# Patient Record
Sex: Female | Born: 1985
Health system: Southern US, Community
[De-identification: ages and names within clinical notes are randomized; demographics above are authoritative.]

## PROBLEM LIST (undated history)

## (undated) DIAGNOSIS — Z800 Family history of malignant neoplasm of digestive organs: Secondary | ICD-10-CM

## (undated) DIAGNOSIS — Z803 Family history of malignant neoplasm of breast: Secondary | ICD-10-CM

## (undated) DIAGNOSIS — Z8041 Family history of malignant neoplasm of ovary: Secondary | ICD-10-CM

## (undated) DIAGNOSIS — K219 Gastro-esophageal reflux disease without esophagitis: Secondary | ICD-10-CM

## (undated) DIAGNOSIS — J45909 Unspecified asthma, uncomplicated: Secondary | ICD-10-CM

## (undated) DIAGNOSIS — J309 Allergic rhinitis, unspecified: Secondary | ICD-10-CM

## (undated) DIAGNOSIS — F419 Anxiety disorder, unspecified: Secondary | ICD-10-CM

## (undated) DIAGNOSIS — F32A Depression, unspecified: Secondary | ICD-10-CM

## (undated) DIAGNOSIS — R109 Unspecified abdominal pain: Secondary | ICD-10-CM

## (undated) DIAGNOSIS — E739 Lactose intolerance, unspecified: Secondary | ICD-10-CM

## (undated) DIAGNOSIS — G43909 Migraine, unspecified, not intractable, without status migrainosus: Secondary | ICD-10-CM

## (undated) DIAGNOSIS — F329 Major depressive disorder, single episode, unspecified: Secondary | ICD-10-CM

## (undated) HISTORY — DX: Family history of malignant neoplasm of ovary: Z80.41

## (undated) HISTORY — DX: Family history of malignant neoplasm of digestive organs: Z80.0

## (undated) HISTORY — DX: Unspecified asthma, uncomplicated: J45.909

## (undated) HISTORY — DX: Lactose intolerance, unspecified: E73.9

## (undated) HISTORY — DX: Anxiety disorder, unspecified: F41.9

## (undated) HISTORY — DX: Gastro-esophageal reflux disease without esophagitis: K21.9

## (undated) HISTORY — DX: Major depressive disorder, single episode, unspecified: F32.9

## (undated) HISTORY — DX: Unspecified abdominal pain: R10.9

## (undated) HISTORY — DX: Depression, unspecified: F32.A

## (undated) HISTORY — DX: Allergic rhinitis, unspecified: J30.9

## (undated) HISTORY — DX: Family history of malignant neoplasm of breast: Z80.3

## (undated) HISTORY — DX: Migraine, unspecified, not intractable, without status migrainosus: G43.909

---
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
## 2011-06-09 HISTORY — PX: DILATION AND CURETTAGE OF UTERUS: SHX78

## 2014-08-03 ENCOUNTER — Other Ambulatory Visit: Payer: Self-pay | Admitting: Physician Assistant

## 2014-08-03 DIAGNOSIS — R110 Nausea: Secondary | ICD-10-CM

## 2014-08-03 DIAGNOSIS — R1013 Epigastric pain: Secondary | ICD-10-CM

## 2014-08-03 DIAGNOSIS — K5900 Constipation, unspecified: Secondary | ICD-10-CM

## 2014-08-03 DIAGNOSIS — R1011 Right upper quadrant pain: Secondary | ICD-10-CM

## 2014-08-06 ENCOUNTER — Ambulatory Visit
Admission: RE | Admit: 2014-08-06 | Discharge: 2014-08-06 | Disposition: A | Discharge status: Home / Self Care | Payer: BLUE CROSS/BLUE SHIELD | Source: Ambulatory Visit | Attending: Physician Assistant | Admitting: Physician Assistant

## 2014-08-06 ENCOUNTER — Other Ambulatory Visit: Payer: Self-pay

## 2014-08-06 DIAGNOSIS — R1013 Epigastric pain: Secondary | ICD-10-CM

## 2014-08-06 DIAGNOSIS — R1011 Right upper quadrant pain: Secondary | ICD-10-CM

## 2014-08-06 DIAGNOSIS — R110 Nausea: Secondary | ICD-10-CM

## 2014-08-06 DIAGNOSIS — K5900 Constipation, unspecified: Secondary | ICD-10-CM

## 2014-10-11 ENCOUNTER — Other Ambulatory Visit (HOSPITAL_COMMUNITY): Payer: Self-pay | Admitting: Obstetrics & Gynecology

## 2014-10-11 ENCOUNTER — Other Ambulatory Visit (HOSPITAL_COMMUNITY)
Admission: RE | Admit: 2014-10-11 | Discharge: 2014-10-11 | Disposition: A | Discharge status: Home / Self Care | Payer: BLUE CROSS/BLUE SHIELD | Source: Ambulatory Visit | Attending: Obstetrics and Gynecology | Admitting: Obstetrics and Gynecology

## 2014-10-11 DIAGNOSIS — Z01419 Encounter for gynecological examination (general) (routine) without abnormal findings: Secondary | ICD-10-CM | POA: Diagnosis not present

## 2014-10-12 LAB — CYTOLOGY - PAP

## 2016-05-07 ENCOUNTER — Other Ambulatory Visit: Payer: Self-pay | Admitting: Family Medicine

## 2016-05-07 DIAGNOSIS — N63 Unspecified lump in unspecified breast: Secondary | ICD-10-CM

## 2016-05-14 ENCOUNTER — Ambulatory Visit
Admission: RE | Admit: 2016-05-14 | Discharge: 2016-05-14 | Disposition: A | Discharge status: Home / Self Care | Payer: 59 | Source: Ambulatory Visit | Attending: Family Medicine | Admitting: Family Medicine

## 2016-05-14 DIAGNOSIS — N63 Unspecified lump in unspecified breast: Secondary | ICD-10-CM

## 2017-06-08 NOTE — L&D Delivery Note (Signed)
Delivery Note At 8:53 PM a viable female was delivered via Vaginal, Spontaneous (Presentation: OA with compound presentation).  APGAR: 8, 9; weight pending  .   Placenta status: to L&D.  Cord:  with the following complications: .  Cord pH: none  Anesthesia:  epidrual Episiotomy: Right Mediolateral with 3rd degree extension (3a) Lacerations: 3rd degree Suture Repair: Interrupted 3-0 suture was used to approximate the sphinctor.  2-0 vicryl was used in a running fashion, the final closure was done with 3-0 vicryl.   Rectal exam was completed- no defects were appreciated  Est. Blood Loss (mL): 101  Mom to postpartum.  Baby to Couplet care / Skin to Skin.  Kathryn Bradley 02/28/2018, 9:24 PM

## 2017-07-07 LAB — OB RESULTS CONSOLE HEPATITIS B SURFACE ANTIGEN: HEP B S AG: NEGATIVE

## 2017-07-07 LAB — OB RESULTS CONSOLE ANTIBODY SCREEN: Antibody Screen: NEGATIVE

## 2017-07-07 LAB — OB RESULTS CONSOLE ABO/RH: RH TYPE: POSITIVE

## 2017-07-07 LAB — OB RESULTS CONSOLE GC/CHLAMYDIA
Chlamydia: NEGATIVE
Gonorrhea: NEGATIVE

## 2017-07-07 LAB — OB RESULTS CONSOLE RUBELLA ANTIBODY, IGM: Rubella: NON-IMMUNE/NOT IMMUNE

## 2017-07-07 LAB — OB RESULTS CONSOLE RPR: RPR: NONREACTIVE

## 2017-07-07 LAB — OB RESULTS CONSOLE HIV ANTIBODY (ROUTINE TESTING): HIV: NONREACTIVE

## 2017-08-24 ENCOUNTER — Telehealth: Payer: Self-pay | Admitting: Neurology

## 2017-08-24 ENCOUNTER — Encounter: Payer: Self-pay | Admitting: Neurology

## 2017-08-24 ENCOUNTER — Encounter: Payer: Self-pay | Admitting: *Deleted

## 2017-08-24 ENCOUNTER — Ambulatory Visit: Payer: 59 | Admitting: Neurology

## 2017-08-24 VITALS — BP 107/66 | HR 95 | Ht 63.0 in | Wt 134.6 lb

## 2017-08-24 DIAGNOSIS — R29898 Other symptoms and signs involving the musculoskeletal system: Secondary | ICD-10-CM | POA: Diagnosis not present

## 2017-08-24 DIAGNOSIS — R29818 Other symptoms and signs involving the nervous system: Secondary | ICD-10-CM

## 2017-08-24 DIAGNOSIS — R200 Anesthesia of skin: Secondary | ICD-10-CM

## 2017-08-24 DIAGNOSIS — R202 Paresthesia of skin: Secondary | ICD-10-CM

## 2017-08-24 DIAGNOSIS — H547 Unspecified visual loss: Secondary | ICD-10-CM | POA: Diagnosis not present

## 2017-08-24 NOTE — Patient Instructions (Signed)
Labs today MRI brain and cervical spine EMG/NCS of right arm for Carpal Tunnel Syndrome

## 2017-08-24 NOTE — Telephone Encounter (Signed)
Patient returned my call and she is scheduled for tues. 08/31/17 on the gna mobile unit.

## 2017-08-24 NOTE — Progress Notes (Signed)
[facility]    Provider:  Dr Jaynee Eagles Referring Provider: Donald Prose, MD Primary Care Physician:  Donald Prose, MD  CC:  shakiness  HPI:  Kathryn Bradley is a lovely 32 y.o. female here as a referral from Dr. Nancy Fetter for shakiness.  Past medical history of anxiety, depression, abdominal pain, migraine headache. She is pregnant. In November Thanksgiving she started feeling something in her right arm, weakness like she felt she had low glucose but it didn't help. Kept happening episodically would last for hours, she would weird and not right but eating wouldn't help, her right hand she would feel weak, she would have to lay her arm down. No tingling or numbness or visible shaking but felt internally shaky. No neck pain or radicular symptoms. She has woken up with the hand numb and tingly and her right foot big toe as well numb and tingly. No hx of focal neurologic symptoms, vision loss, she had a migraine once. She once had vision loss and vision spots in her vision growing up without a headache. No FHx of seizures or MS. No symptoms in the face. She has had muscle twitching in her right leg and left arm. No other focal neurologic deficits, associated symptoms, inciting events or modifiable factors.  Reviewed notes, labs and imaging from outside physicians, which showed:   CBC unremarkable, CMP unremarkable with BUN 11 and creatinine 0.70.  Otherwise unremarkable, TSH was normal 2.48, hCG negative, LDL 71, CRP normal, RF negative, sed rate normal, RPR nonreactive, HIV nonreactive.  Patient feels in her arms and her heart is beating really hard.  Not sure if it just her nares because she is super aware of how she is feeling or if it something else.  They referred her over here for this.  She reported weakness and shakiness.  Feeling shaky shortly after Thanksgiving a couple weeks ago.  This happened in the past of her blood sugar was low but she tried to eat something and symptoms did not  improve.  She tried switching coffee, she was drinking some with higher caffeine intake but this did not provide relief.  Symptoms occur daily.  She can occupy herself and forget about symptoms and notices again.  She has difficulty moving her thumb across the screen due to symptoms though she is not visibly shaking.  She has difficulty sleeping this is baseline for her.  She woke up with tingling in the right hand.  She feels her heart racing but she does report anxiety associated with symptoms.  Exam was negative.  Review of Systems: Patient complains of symptoms per HPI as well as the following symptoms: feeling cold, sleepiness, allergies. Pertinent negatives and positives per HPI. All others negative.   Social History   Socioeconomic History  . Marital status: Married    Spouse name: Not on file  . Number of children: Not on file  . Years of education: Not on file  . Highest education level: Associate degree: academic program  Social Needs  . Financial resource strain: Not on file  . Food insecurity - worry: Not on file  . Food insecurity - inability: Not on file  . Transportation needs - medical: Not on file  . Transportation needs - non-medical: Not on file  Occupational History  . Not on file  Tobacco Use  . Smoking status: Never Smoker  . Smokeless tobacco: Never Used  Substance and Sexual Activity  . Alcohol use: No    Frequency: Never  .  Drug use: No  . Sexual activity: Not on file  Other Topics Concern  . Not on file  Social History Narrative   She is currently [redacted] weeks pregnant with her first child (08/24/17).    Lives at home with her husband   Right handed   No caffeine    Family History  Problem Relation Age of Onset  . Breast cancer Mother   . Heart attack Mother   . Heart Problems Father     Past Medical History:  Diagnosis Date  . Abdominal pain   . Allergic rhinitis   . Anxiety   . Asthma   . Depression   . Esophageal reflux   . Lactose  intolerance   . Migraine headache     Past Surgical History:  Procedure Laterality Date  . WISDOM TOOTH EXTRACTION  2005    Current Outpatient Medications  Medication Sig Dispense Refill  . loratadine (CLARITIN) 10 MG tablet Take 10 mg by mouth daily.    . Prenatal Multivit-Min-Fe-FA (PRENATAL VITAMINS PO) Take 1 tablet by mouth daily. Multivitamin/multimineral with folic acid     No current facility-administered medications for this visit.     Allergies as of 08/24/2017  . (Not on File)    Vitals: BP 107/66 (BP Location: Left Arm, Patient Position: Sitting)   Pulse 95   Ht 5\' 3"  (1.6 m)   Wt 134 lb 9.6 oz (61.1 kg)   BMI 23.84 kg/m  Last Weight:  Wt Readings from Last 1 Encounters:  08/24/17 134 lb 9.6 oz (61.1 kg)   Last Height:   Ht Readings from Last 1 Encounters:  08/24/17 5\' 3"  (1.6 m)   Physical exam: Exam: Gen: NAD, conversant, well nourised, well groomed                     CV: RRR, no MRG. No Carotid Bruits. No peripheral edema, warm, nontender Eyes: Conjunctivae clear without exudates or hemorrhage  Neuro: Detailed Neurologic Exam  Speech:    Speech is normal; fluent and spontaneous with normal comprehension.  Cognition:    The patient is oriented to person, place, and time;     recent and remote memory intact;     language fluent;     normal attention, concentration,     fund of knowledge Cranial Nerves:    The pupils are equal, round, and reactive to light. The fundi are normal and spontaneous venous pulsations are present. Visual fields are full to finger confrontation. Extraocular movements are intact. Trigeminal sensation is intact and the muscles of mastication are normal. The face is symmetric. The palate elevates in the midline. Hearing intact. Voice is normal. Shoulder shrug is normal. The tongue has normal motion without fasciculations.   Coordination:    Normal finger to nose and heel to shin. Normal rapid alternating movements.    Gait:    Heel-toe and tandem gait are normal.   Motor Observation:    No asymmetry, no atrophy, and no involuntary movements noted. Tone:    Normal muscle tone.    Posture:    Posture is normal. normal erect    Strength:    Strength is V/V in the upper and lower limbs.      Sensation: decreased sensation right hand     Reflex Exam:  DTR's:    Deep tendon reflexes in the upper and lower extremities are normal bilaterally.   Toes:    The toes are downgoing bilaterally.  Clonus:    Clonus is absent.  Negative phalen's and tinel's sign      Assessment/Plan:  Lovely 32 year old with right arm weakness, vision loss, numbness and tingling in the right arm and leg and she has notice symptoms growing up need MRI brain and cervical spine to evaluate for MS.   EMG/NCS of the right arm(also left arm if right arm is normal) for CTS MRI brain and cervical spine to evaluate for multiple sclerosis given focal neurologic symptoms Labs  Orders Placed This Encounter  Procedures  . MR BRAIN WO CONTRAST  . MR CERVICAL SPINE WO CONTRAST  . ANA, IFA (with reflex)  . B12 and Folate Panel  . Methylmalonic acid, serum  . NCV with EMG(electromyography)   Cc: Donald Prose, MD   Sarina Ill, MD  [facility] [address] [place], [place] [address]  Phone [phone] Fax [phone]

## 2017-08-24 NOTE — Telephone Encounter (Signed)
[place]: [phone] & [phone] (exp. 08/24/17 to 10/08/17) left voicemail for patient to call back about scheduling mri.

## 2017-08-26 ENCOUNTER — Telehealth: Payer: Self-pay | Admitting: *Deleted

## 2017-08-26 LAB — B12 AND FOLATE PANEL: Vitamin B-12: 483 pg/mL (ref 232–1245)

## 2017-08-26 LAB — METHYLMALONIC ACID, SERUM: Methylmalonic Acid: 80 nmol/L (ref 0–378)

## 2017-08-26 LAB — ANTINUCLEAR ANTIBODIES, IFA: ANA Titer 1: NEGATIVE

## 2017-08-26 NOTE — Telephone Encounter (Signed)
Called pt & LVM asking for call back. When she calls back, please let her know that her labs are normal.

## 2017-08-26 NOTE — Telephone Encounter (Signed)
-----   Message from Melvenia Beam, MD sent at 08/26/2017 12:16 PM EDT ----- Labs normal thanks

## 2017-08-27 NOTE — Telephone Encounter (Signed)
Pt returned RN's call. Message relayed labs were normals. Pt was appreciative

## 2017-08-31 ENCOUNTER — Ambulatory Visit: Payer: 59

## 2017-08-31 DIAGNOSIS — R202 Paresthesia of skin: Secondary | ICD-10-CM

## 2017-08-31 DIAGNOSIS — H547 Unspecified visual loss: Secondary | ICD-10-CM

## 2017-08-31 DIAGNOSIS — R29818 Other symptoms and signs involving the nervous system: Secondary | ICD-10-CM

## 2017-08-31 DIAGNOSIS — R200 Anesthesia of skin: Secondary | ICD-10-CM | POA: Diagnosis not present

## 2017-08-31 DIAGNOSIS — R29898 Other symptoms and signs involving the musculoskeletal system: Secondary | ICD-10-CM

## 2017-09-01 ENCOUNTER — Ambulatory Visit: Payer: 59 | Admitting: Neurology

## 2017-09-01 ENCOUNTER — Ambulatory Visit (INDEPENDENT_AMBULATORY_CARE_PROVIDER_SITE_OTHER): Payer: 59 | Admitting: Neurology

## 2017-09-01 DIAGNOSIS — H547 Unspecified visual loss: Secondary | ICD-10-CM

## 2017-09-01 DIAGNOSIS — R29818 Other symptoms and signs involving the nervous system: Secondary | ICD-10-CM | POA: Diagnosis not present

## 2017-09-01 DIAGNOSIS — Z0289 Encounter for other administrative examinations: Secondary | ICD-10-CM

## 2017-09-01 DIAGNOSIS — R29898 Other symptoms and signs involving the musculoskeletal system: Secondary | ICD-10-CM | POA: Diagnosis not present

## 2017-09-01 DIAGNOSIS — R200 Anesthesia of skin: Secondary | ICD-10-CM | POA: Diagnosis not present

## 2017-09-01 DIAGNOSIS — R202 Paresthesia of skin: Secondary | ICD-10-CM | POA: Diagnosis not present

## 2017-09-02 ENCOUNTER — Encounter: Payer: Self-pay | Admitting: *Deleted

## 2017-09-02 ENCOUNTER — Telehealth: Payer: Self-pay | Admitting: *Deleted

## 2017-09-02 NOTE — Telephone Encounter (Addendum)
Called pt & LVM (ok per DPR) informing pt that both her MRI brain & MRI cervical spine were normal. Return call not required but encouraged pt to call with any questions. Left office number in message.   ----- Message from Melvenia Beam, MD sent at 09/01/2017  4:44 PM EDT ----- normal  Notes recorded by Melvenia Beam, MD on 09/01/2017 at 4:44 PM EDT Normal

## 2017-09-02 NOTE — Telephone Encounter (Signed)
error 

## 2017-09-02 NOTE — Procedures (Signed)
Full Name: Kathryn Bradley Gender: Female MRN #: [id number] Date of Birth: 02-Jul-1985    Visit Date: 09/01/2017 09:10 Age: 32 Years 24 Months Old Examining Physician: Sarina Ill, MD  Referring Physician: Donald Prose, MD  History: Lovely 32 year old with right arm weakness, numbness and tingling in the right arm and leg.  Summary: All nerves and muscles (as detailed in the following tables) were within normal limits.     Conclusion: Normal study  Sarina Ill M.D.  [facility] [place], [place] [address] Tel: [phone] Fax: [phone]        [facility]    Nerve / Sites Muscle Latency Ref. Amplitude Ref. Rel Amp Segments Distance Velocity Ref. Area    ms ms mV mV %  cm m/s m/s mVms  R Median - APB     Wrist APB 3.0 ?4.4 12.8 ?4.0 100 Wrist - APB 7   45.8     Upper arm APB 6.5  12.7  99 Upper arm - Wrist 20 57 ?49 45.4  R Ulnar - ADM     Wrist ADM 2.6 ?3.3 11.0 ?6.0 100 Wrist - ADM 7   32.9     B.Elbow ADM 5.8  11.2  101 B.Elbow - Wrist 18 56 ?49 34.4     A.Elbow ADM 7.5  11.3  101 A.Elbow - B.Elbow 10 58 ?49 33.5         A.Elbow - Wrist      R Peroneal - EDB     Ankle EDB 4.7 ?6.5 10.9 ?2.0 100 Ankle - EDB 9   34.3     Fib head EDB 10.2  10.3  94.5 Fib head - Ankle 28 51 ?44 32.9     Pop fossa EDB 12.7  10.5  102 Pop fossa - Fib head 12 49 ?44 38.1         Pop fossa - Ankle      R Tibial - AH     Ankle AH 4.0 ?5.8 27.4 ?4.0 100 Ankle - AH 9   63.9     Pop fossa AH 12.2  22.3  81.3 Pop fossa - Ankle 35 43 ?41 61.7             SNC    Nerve / Sites Rec. Site Peak Lat Ref.  Amp Ref. Segments Distance Peak Diff Ref.    ms ms V V  cm ms ms  R Radial - Anatomical snuff box (Forearm)     Forearm Wrist 2.4 ?2.9 49 ?15 Forearm - Wrist 10    R Sural - Ankle (Calf)     Calf Ankle 4.0 ?4.4 17 ?6 Calf - Ankle 14    R Superficial peroneal - Ankle     Lat leg Ankle 3.8 ?4.4 9 ?6 Lat leg - Ankle 14    R Median, Ulnar - Transcarpal comparison    Median Palm Wrist 2.0 ?2.2 53 ?35 Median Palm - Wrist 8       Ulnar Palm Wrist 2.0 ?2.2 54 ?12 Ulnar Palm - Wrist 8          Median Palm - Ulnar Palm  0.0 ?0.4  R Median - Orthodromic (Dig II, Mid palm)     Dig II Wrist 3.0 ?3.4 17 ?10 Dig II - Wrist 13    R Ulnar - Orthodromic, (Dig V, Mid palm)     Dig V Wrist 2.7 ?3.1 16 ?5 Dig  V - Wrist 11                   F  Wave    Nerve F Lat Ref.   ms ms  R Tibial - AH 47.2 ?56.0  R Ulnar - ADM 25.4 ?32.0         EMG full       EMG Summary Table    Spontaneous MUAP Recruitment  Muscle IA Fib PSW Fasc Other Amp Dur. Poly Pattern  R. Deltoid Normal None None None _______ Normal Normal Normal Normal  R. Triceps brachii Normal None None None _______ Normal Normal Normal Normal  R. Pronator teres Normal None None None _______ Normal Normal Normal Normal  R. First dorsal interosseous Normal None None None _______ Normal Normal Normal Normal  R. Opponens pollicis Normal None None None _______ Normal Normal Normal Normal  R. Iliopsoas Normal None None None _______ Normal Normal Normal Normal  R. Vastus medialis Normal None None None _______ Normal Normal Normal Normal  R. Tibialis anterior Normal None None None _______ Normal Normal Normal Normal  R. Gastrocnemius (Medial head) Normal None None None _______ Normal Normal Normal Normal  R. Extensor hallucis longus Normal None None None _______ Normal Normal Normal Normal

## 2017-09-02 NOTE — Progress Notes (Signed)
Full Name: Kathryn Bradley Gender: Female MRN #: [id number] Date of Birth: May 25, 1986    Visit Date: 09/01/2017 09:10 Age: 32 Years 35 Months Old Examining Physician: Sarina Ill, MD  Referring Physician: Donald Prose, MD  History: Lovely 32 year old with right arm weakness, numbness and tingling in the right arm and leg.  Summary: All nerves and muscles (as detailed in the following tables) were within normal limits.     Conclusion: Normal study  Sarina Ill M.D.  [facility] [place], [place] [address] Tel: [phone] Fax: [phone]        [facility]    Nerve / Sites Muscle Latency Ref. Amplitude Ref. Rel Amp Segments Distance Velocity Ref. Area    ms ms mV mV %  cm m/s m/s mVms  R Median - APB     Wrist APB 3.0 ?4.4 12.8 ?4.0 100 Wrist - APB 7   45.8     Upper arm APB 6.5  12.7  99 Upper arm - Wrist 20 57 ?49 45.4  R Ulnar - ADM     Wrist ADM 2.6 ?3.3 11.0 ?6.0 100 Wrist - ADM 7   32.9     B.Elbow ADM 5.8  11.2  101 B.Elbow - Wrist 18 56 ?49 34.4     A.Elbow ADM 7.5  11.3  101 A.Elbow - B.Elbow 10 58 ?49 33.5         A.Elbow - Wrist      R Peroneal - EDB     Ankle EDB 4.7 ?6.5 10.9 ?2.0 100 Ankle - EDB 9   34.3     Fib head EDB 10.2  10.3  94.5 Fib head - Ankle 28 51 ?44 32.9     Pop fossa EDB 12.7  10.5  102 Pop fossa - Fib head 12 49 ?44 38.1         Pop fossa - Ankle      R Tibial - AH     Ankle AH 4.0 ?5.8 27.4 ?4.0 100 Ankle - AH 9   63.9     Pop fossa AH 12.2  22.3  81.3 Pop fossa - Ankle 35 43 ?41 61.7             SNC    Nerve / Sites Rec. Site Peak Lat Ref.  Amp Ref. Segments Distance Peak Diff Ref.    ms ms V V  cm ms ms  R Radial - Anatomical snuff box (Forearm)     Forearm Wrist 2.4 ?2.9 49 ?15 Forearm - Wrist 10    R Sural - Ankle (Calf)     Calf Ankle 4.0 ?4.4 17 ?6 Calf - Ankle 14    R Superficial peroneal - Ankle     Lat leg Ankle 3.8 ?4.4 9 ?6 Lat leg - Ankle 14    R Median, Ulnar - Transcarpal comparison    Median Palm Wrist 2.0 ?2.2 53 ?35 Median Palm - Wrist 8       Ulnar Palm Wrist 2.0 ?2.2 54 ?12 Ulnar Palm - Wrist 8          Median Palm - Ulnar Palm  0.0 ?0.4  R Median - Orthodromic (Dig II, Mid palm)     Dig II Wrist 3.0 ?3.4 17 ?10 Dig II - Wrist 13    R Ulnar - Orthodromic, (Dig V, Mid palm)     Dig V Wrist 2.7 ?3.1 16 ?5 Dig  V - Wrist 11                   F  Wave    Nerve F Lat Ref.   ms ms  R Tibial - AH 47.2 ?56.0  R Ulnar - ADM 25.4 ?32.0         EMG full       EMG Summary Table    Spontaneous MUAP Recruitment  Muscle IA Fib PSW Fasc Other Amp Dur. Poly Pattern  R. Deltoid Normal None None None _______ Normal Normal Normal Normal  R. Triceps brachii Normal None None None _______ Normal Normal Normal Normal  R. Pronator teres Normal None None None _______ Normal Normal Normal Normal  R. First dorsal interosseous Normal None None None _______ Normal Normal Normal Normal  R. Opponens pollicis Normal None None None _______ Normal Normal Normal Normal  R. Iliopsoas Normal None None None _______ Normal Normal Normal Normal  R. Vastus medialis Normal None None None _______ Normal Normal Normal Normal  R. Tibialis anterior Normal None None None _______ Normal Normal Normal Normal  R. Gastrocnemius (Medial head) Normal None None None _______ Normal Normal Normal Normal  R. Extensor hallucis longus Normal None None None _______ Normal Normal Normal Normal

## 2017-09-02 NOTE — Progress Notes (Signed)
Discussed MRI images with patient, showed her the images and answered all questions. MRI of the brain and cervical spine normal. Reviewed labs with patient, B12, MMA, ANA all normal.  Follow up after the birth of her baby or sooner if symptoms worsen. No suggestion of MS or any neurologic/neurodegenerate disorder, neuropathy or myopathy. Also discussed EMG/NCS results which were normal. A total of 25 minutes was spent face-to-face with this patient. Over half this time was spent on counseling patient on the Paresthesias diagnosis and different diagnostic and therapeutic options, this time is in addition to time spent performing emg/ncs.

## 2017-09-02 NOTE — Progress Notes (Signed)
See procedure note.

## 2017-11-22 DIAGNOSIS — J069 Acute upper respiratory infection, unspecified: Secondary | ICD-10-CM | POA: Diagnosis not present

## 2017-11-30 DIAGNOSIS — Z3482 Encounter for supervision of other normal pregnancy, second trimester: Secondary | ICD-10-CM | POA: Diagnosis not present

## 2017-12-21 DIAGNOSIS — Z23 Encounter for immunization: Secondary | ICD-10-CM | POA: Diagnosis not present

## 2018-01-04 DIAGNOSIS — L918 Other hypertrophic disorders of the skin: Secondary | ICD-10-CM | POA: Diagnosis not present

## 2018-01-04 DIAGNOSIS — D229 Melanocytic nevi, unspecified: Secondary | ICD-10-CM | POA: Diagnosis not present

## 2018-01-31 DIAGNOSIS — Z3483 Encounter for supervision of other normal pregnancy, third trimester: Secondary | ICD-10-CM | POA: Diagnosis not present

## 2018-02-11 DIAGNOSIS — Z23 Encounter for immunization: Secondary | ICD-10-CM | POA: Diagnosis not present

## 2018-02-18 ENCOUNTER — Telehealth (HOSPITAL_COMMUNITY): Payer: Self-pay | Admitting: *Deleted

## 2018-02-18 ENCOUNTER — Encounter (HOSPITAL_COMMUNITY): Payer: Self-pay | Admitting: *Deleted

## 2018-02-18 LAB — OB RESULTS CONSOLE GBS: GBS: NEGATIVE

## 2018-02-18 NOTE — Telephone Encounter (Signed)
Preadmission screen  

## 2018-02-28 ENCOUNTER — Inpatient Hospital Stay (HOSPITAL_COMMUNITY): Payer: 59 | Admitting: Anesthesiology

## 2018-02-28 ENCOUNTER — Inpatient Hospital Stay (HOSPITAL_COMMUNITY)
Admission: AD | Admit: 2018-02-28 | Discharge: 2018-03-02 | DRG: 768 | Disposition: A | Discharge status: Home / Self Care | Payer: 59 | Attending: Obstetrics & Gynecology | Admitting: Obstetrics & Gynecology

## 2018-02-28 ENCOUNTER — Encounter (HOSPITAL_COMMUNITY): Payer: Self-pay | Admitting: *Deleted

## 2018-02-28 DIAGNOSIS — O9962 Diseases of the digestive system complicating childbirth: Secondary | ICD-10-CM | POA: Diagnosis present

## 2018-02-28 DIAGNOSIS — Z3A40 40 weeks gestation of pregnancy: Secondary | ICD-10-CM

## 2018-02-28 DIAGNOSIS — O326XX0 Maternal care for compound presentation, not applicable or unspecified: Principal | ICD-10-CM | POA: Diagnosis present

## 2018-02-28 DIAGNOSIS — K219 Gastro-esophageal reflux disease without esophagitis: Secondary | ICD-10-CM | POA: Diagnosis present

## 2018-02-28 DIAGNOSIS — Z3483 Encounter for supervision of other normal pregnancy, third trimester: Secondary | ICD-10-CM | POA: Diagnosis not present

## 2018-02-28 LAB — CBC
HEMATOCRIT: 37 % (ref 36.0–46.0)
Hemoglobin: 12.6 g/dL (ref 12.0–15.0)
MCH: 31 pg (ref 26.0–34.0)
MCHC: 34.1 g/dL (ref 30.0–36.0)
MCV: 91.1 fL (ref 78.0–100.0)
PLATELETS: 238 10*3/uL (ref 150–400)
RBC: 4.06 MIL/uL (ref 3.87–5.11)
RDW: 13.7 % (ref 11.5–15.5)
WBC: 11.5 10*3/uL — AB (ref 4.0–10.5)

## 2018-02-28 LAB — ABO/RH: ABO/RH(D): A POS

## 2018-02-28 LAB — TYPE AND SCREEN
ABO/RH(D): A POS
ANTIBODY SCREEN: NEGATIVE

## 2018-02-28 LAB — RPR: RPR: NONREACTIVE

## 2018-02-28 MED ORDER — OXYTOCIN 40 UNITS IN LACTATED RINGERS INFUSION - SIMPLE MED
2.5000 [IU]/h | INTRAVENOUS | Status: DC
  Administered 2018-02-28: 2.5 [IU]/h via INTRAVENOUS
  Filled 2018-02-28: qty 1000

## 2018-02-28 MED ORDER — IBUPROFEN 600 MG PO TABS
600.0000 mg | ORAL_TABLET | Freq: Four times a day (QID) | ORAL | Status: DC
  Administered 2018-02-28 – 2018-03-02 (×7): 600 mg via ORAL
  Filled 2018-02-28 (×7): qty 1

## 2018-02-28 MED ORDER — DIBUCAINE 1 % RE OINT: 1.0000 "application " | TOPICAL_OINTMENT | RECTAL | Status: DC | PRN

## 2018-02-28 MED ORDER — DIPHENHYDRAMINE HCL 25 MG PO CAPS: 25.0000 mg | ORAL_CAPSULE | Freq: Four times a day (QID) | ORAL | Status: DC | PRN

## 2018-02-28 MED ORDER — CEFAZOLIN SODIUM-DEXTROSE 2-4 GM/100ML-% IV SOLN
2.0000 g | Freq: Three times a day (TID) | INTRAVENOUS | Status: DC
  Administered 2018-02-28 – 2018-03-01 (×2): 2 g via INTRAVENOUS
  Filled 2018-02-28 (×2): qty 100

## 2018-02-28 MED ORDER — MEASLES, MUMPS & RUBELLA VAC ~~LOC~~ INJ
0.5000 mL | INJECTION | Freq: Once | SUBCUTANEOUS | Status: AC
  Administered 2018-03-01: 0.5 mL via SUBCUTANEOUS
  Filled 2018-02-28: qty 0.5

## 2018-02-28 MED ORDER — OXYCODONE-ACETAMINOPHEN 5-325 MG PO TABS: 1.0000 | ORAL_TABLET | ORAL | Status: DC | PRN

## 2018-02-28 MED ORDER — BUTORPHANOL TARTRATE 1 MG/ML IJ SOLN: 1.0000 mg | INTRAMUSCULAR | Status: DC | PRN

## 2018-02-28 MED ORDER — FENTANYL 2.5 MCG/ML BUPIVACAINE 1/10 % EPIDURAL INFUSION (WH - ANES)
14.0000 mL/h | INTRAMUSCULAR | Status: DC | PRN
  Administered 2018-02-28 (×2): 14 mL/h via EPIDURAL
  Filled 2018-02-28 (×2): qty 100

## 2018-02-28 MED ORDER — PHENYLEPHRINE 40 MCG/ML (10ML) SYRINGE FOR IV PUSH (FOR BLOOD PRESSURE SUPPORT)
80.0000 ug | PREFILLED_SYRINGE | INTRAVENOUS | Status: DC | PRN
  Filled 2018-02-28: qty 5
  Filled 2018-02-28: qty 10

## 2018-02-28 MED ORDER — LIDOCAINE HCL (PF) 1 % IJ SOLN
30.0000 mL | INTRAMUSCULAR | Status: DC | PRN
  Filled 2018-02-28: qty 30

## 2018-02-28 MED ORDER — ACETAMINOPHEN 325 MG PO TABS
650.0000 mg | ORAL_TABLET | ORAL | Status: DC | PRN
  Administered 2018-03-01 – 2018-03-02 (×5): 650 mg via ORAL
  Filled 2018-02-28 (×5): qty 2

## 2018-02-28 MED ORDER — ONDANSETRON HCL 4 MG PO TABS: 4.0000 mg | ORAL_TABLET | ORAL | Status: DC | PRN

## 2018-02-28 MED ORDER — LACTATED RINGERS IV SOLN: 500.0000 mL | INTRAVENOUS | Status: DC | PRN

## 2018-02-28 MED ORDER — PHENYLEPHRINE 40 MCG/ML (10ML) SYRINGE FOR IV PUSH (FOR BLOOD PRESSURE SUPPORT)
80.0000 ug | PREFILLED_SYRINGE | INTRAVENOUS | Status: DC | PRN
  Filled 2018-02-28: qty 5

## 2018-02-28 MED ORDER — OXYTOCIN BOLUS FROM INFUSION
500.0000 mL | Freq: Once | INTRAVENOUS | Status: AC
  Administered 2018-02-28: 500 mL via INTRAVENOUS

## 2018-02-28 MED ORDER — EPHEDRINE 5 MG/ML INJ
10.0000 mg | INTRAVENOUS | Status: DC | PRN
  Filled 2018-02-28: qty 2

## 2018-02-28 MED ORDER — ACETAMINOPHEN 325 MG PO TABS
650.0000 mg | ORAL_TABLET | ORAL | Status: DC | PRN
  Administered 2018-02-28: 650 mg via ORAL
  Filled 2018-02-28: qty 2

## 2018-02-28 MED ORDER — SIMETHICONE 80 MG PO CHEW: 80.0000 mg | CHEWABLE_TABLET | ORAL | Status: DC | PRN

## 2018-02-28 MED ORDER — ZOLPIDEM TARTRATE 5 MG PO TABS: 5.0000 mg | ORAL_TABLET | Freq: Every evening | ORAL | Status: DC | PRN

## 2018-02-28 MED ORDER — BENZOCAINE-MENTHOL 20-0.5 % EX AERO
1.0000 "application " | INHALATION_SPRAY | CUTANEOUS | Status: DC | PRN
  Filled 2018-02-28: qty 56

## 2018-02-28 MED ORDER — WITCH HAZEL-GLYCERIN EX PADS
1.0000 "application " | MEDICATED_PAD | CUTANEOUS | Status: DC | PRN
  Administered 2018-03-01: 1 via TOPICAL

## 2018-02-28 MED ORDER — ONDANSETRON HCL 4 MG/2ML IJ SOLN: 4.0000 mg | INTRAMUSCULAR | Status: DC | PRN

## 2018-02-28 MED ORDER — DIPHENHYDRAMINE HCL 50 MG/ML IJ SOLN: 12.5000 mg | INTRAMUSCULAR | Status: DC | PRN

## 2018-02-28 MED ORDER — ONDANSETRON HCL 4 MG/2ML IJ SOLN: 4.0000 mg | Freq: Four times a day (QID) | INTRAMUSCULAR | Status: DC | PRN

## 2018-02-28 MED ORDER — SENNOSIDES-DOCUSATE SODIUM 8.6-50 MG PO TABS
2.0000 | ORAL_TABLET | ORAL | Status: DC
  Administered 2018-02-28 – 2018-03-01 (×2): 2 via ORAL
  Filled 2018-02-28 (×2): qty 2

## 2018-02-28 MED ORDER — PRENATAL MULTIVITAMIN CH
1.0000 | ORAL_TABLET | Freq: Every day | ORAL | Status: DC
  Administered 2018-03-01 – 2018-03-02 (×2): 1 via ORAL
  Filled 2018-02-28 (×2): qty 1

## 2018-02-28 MED ORDER — SOD CITRATE-CITRIC ACID 500-334 MG/5ML PO SOLN: 30.0000 mL | ORAL | Status: DC | PRN

## 2018-02-28 MED ORDER — LACTATED RINGERS IV SOLN
500.0000 mL | Freq: Once | INTRAVENOUS | Status: AC
  Administered 2018-02-28: 1000 mL via INTRAVENOUS

## 2018-02-28 MED ORDER — LACTATED RINGERS IV SOLN
INTRAVENOUS | Status: DC
  Administered 2018-02-28 (×3): via INTRAVENOUS

## 2018-02-28 MED ORDER — OXYCODONE-ACETAMINOPHEN 5-325 MG PO TABS: 2.0000 | ORAL_TABLET | ORAL | Status: DC | PRN

## 2018-02-28 MED ORDER — LIDOCAINE-EPINEPHRINE (PF) 2 %-1:200000 IJ SOLN
INTRAMUSCULAR | Status: DC | PRN
  Administered 2018-02-28 (×2): 4 mL via EPIDURAL
  Administered 2018-02-28: 2 mL via EPIDURAL

## 2018-02-28 MED ORDER — COCONUT OIL OIL: 1.0000 "application " | TOPICAL_OIL | Status: DC | PRN

## 2018-02-28 NOTE — Anesthesia Preprocedure Evaluation (Signed)
Anesthesia Evaluation  Patient identified by MRN, date of birth, ID band Patient awake    Reviewed: Allergy & Precautions, NPO status , Patient's Chart, lab work & pertinent test results  Airway Mallampati: I  TM Distance: >3 FB Neck ROM: Full    Dental no notable dental hx. (+) Teeth Intact   Pulmonary asthma ,    Pulmonary exam normal breath sounds clear to auscultation       Cardiovascular negative cardio ROS Normal cardiovascular exam Rhythm:Regular Rate:Normal     Neuro/Psych  Headaches, Anxiety Depression    GI/Hepatic Neg liver ROS, GERD  ,  Endo/Other  negative endocrine ROS  Renal/GU negative Renal ROS  negative genitourinary   Musculoskeletal negative musculoskeletal ROS (+)   Abdominal   Peds  Hematology negative hematology ROS (+)   Anesthesia Other Findings   Reproductive/Obstetrics (+) Pregnancy                             Anesthesia Physical Anesthesia Plan  ASA: III  Anesthesia Plan: Epidural   Post-op Pain Management:    Induction:   PONV Risk Score and Plan: Treatment may vary due to age or medical condition  Airway Management Planned: Natural Airway  Additional Equipment:   Intra-op Plan:   Post-operative Plan:   Informed Consent: I have reviewed the patients History and Physical, chart, labs and discussed the procedure including the risks, benefits and alternatives for the proposed anesthesia with the patient or authorized representative who has indicated his/her understanding and acceptance.     Plan Discussed with: Anesthesiologist  Anesthesia Plan Comments: (Patient identified. Risks, benefits, options discussed with patient including but not limited to bleeding, infection, nerve damage, paralysis, failed block, incomplete pain control, headache, blood pressure changes, nausea, vomiting, reactions to medication, itching, and post partum back pain.  Confirmed with bedside nurse the patient's most recent platelet count. Confirmed with the patient that they are not taking any anticoagulation, have any bleeding history or any family history of bleeding disorders. Patient expressed understanding and wishes to proceed. All questions were answered. )        Anesthesia Quick Evaluation

## 2018-02-28 NOTE — MAU Note (Signed)
Pt C/O uc's since 0200, have become progressively more frequent & intense.  Has some bloody mucus, denies LOF.  Reports good fetal movement.

## 2018-02-28 NOTE — H&P (Signed)
HPI: 32 y/o G2P0010 @ [redacted]w[redacted]d estimated gestational age (as dated by LMP c/w 20 week ultrasound) presents complaining of painful contractions.   no Leaking of Fluid,   Slight spotting,   + Uterine Contractions,  + Fetal Movement.  Prenatal care has been provided by Dr. Ozan  ROS: no HA, no epigastric pain, no visual changes.    Pregnancy complicated by: 1) GERD- zantac prn 2) Rubella- NON immune- will need MMR postpartum   Prenatal Transfer Tool  Maternal Diabetes: No Genetic Screening: Normal Maternal Ultrasounds/Referrals: Normal Fetal Ultrasounds or other Referrals:  None Maternal Substance Abuse:  No Significant Maternal Medications:  Meds include: Zantac Significant Maternal Lab Results: Lab values include: Group B Strep negative   PNL:  GBS neg, Rub NON-Immune, Hep B neg, RPR NR, HIV neg, GC/C neg, glucola:126 Hgb: 11.5 Blood type: A positive, antibody neg  Immunizations: Tdap: 7/16 Flu: 9/6  OBHx: SAB x 1 PMHx:  Seasonal allergies, acid reflux Meds:  PNV, allegra, zantac prn Allergy:   Allergies  Allergen Reactions  . Tape    SurgHx: none SocHx:   no Tobacco, no  EtOH, no Illicit Drugs  O: BP 130/76 (BP Location: Right Arm)   Pulse (!) 102   Temp 98 F (36.7 C) (Oral)   Resp 20   Ht 5' 3" (1.6 m)   Wt 71.7 kg   LMP 05/15/2017   BMI 27.99 kg/m    Gen. AAOx3, NAD CV.  RRR   Resp. Normal respiratory rate and effort Abd. Gravid,  no tenderness,  no rigidity,  no guarding Extr.  no edema B/L , no calf tenderness, neg Homan's B/L  FHT: 150 baseline, moderate variability, + accels,  no decels Toco: q 1-4 min SVE: 7/90/-1, AROM clear  Labs: see orders  A/P:  32 y.o. G2P0010 @ [redacted]w[redacted]d EGA who presents for labor -FWB:  NICHD Cat I FHTs -Labor: expectant management -GBS: negative -Pain management: continue epidural  Jennifer Ozan, DO [phone] (cell) [phone] (office)     

## 2018-02-28 NOTE — Anesthesia Pain Management Evaluation Note (Signed)
  CRNA Pain Management Visit Note  Patient: Kathryn Bradley, 32 y.o., female  "Hello I am a member of the anesthesia team at [facility]. We have an anesthesia team available at all times to provide care throughout the hospital, including epidural management and anesthesia for C-section. I don't know your plan for the delivery whether it a natural birth, water birth, IV sedation, nitrous supplementation, doula or epidural, but we want to meet your pain goals."   1.Was your pain managed to your expectations on prior hospitalizations?   No prior hospitalizations  2.What is your expectation for pain management during this hospitalization?     Various modes of pain relief were discussed including a labor epidural and IV pain medication.  3.How can we help you reach that goal? The patient wishes to consider her options for the time being.  Record the patient's initial score and the patient's pain goal.   Pain: 8  Pain Goal: 10 [facility] wants you to be able to say your pain was always managed very well.  Jaid Quirion 02/28/2018

## 2018-02-28 NOTE — Anesthesia Procedure Notes (Signed)
Epidural Patient location during procedure: OB Start time: 02/28/2018 12:35 PM End time: 02/28/2018 12:50 PM  Staffing Anesthesiologist: Freddrick March, MD Performed: anesthesiologist   Preanesthetic Checklist Completed: patient identified, pre-op evaluation, timeout performed, IV checked, risks and benefits discussed and monitors and equipment checked  Epidural Patient position: sitting Prep: site prepped and draped and DuraPrep Patient monitoring: continuous pulse ox, blood pressure, heart rate and cardiac monitor Approach: midline Location: L3-L4 Injection technique: LOR air  Needle:  Needle type: Tuohy  Needle gauge: 17 G Needle length: 9 cm Needle insertion depth: 4.5 cm Catheter type: closed end flexible Catheter size: 19 Gauge Catheter at skin depth: 10 cm Test dose: negative  Assessment Sensory level: T8 Events: blood not aspirated, injection not painful, no injection resistance, negative IV test and no paresthesia  Additional Notes Patient identified. Risks/Benefits/Options discussed with patient including but not limited to bleeding, infection, nerve damage, paralysis, failed block, incomplete pain control, headache, blood pressure changes, nausea, vomiting, reactions to medication both or allergic, itching and postpartum back pain. Confirmed with bedside nurse the patient's most recent platelet count. Confirmed with patient that they are not currently taking any anticoagulation, have any bleeding history or any family history of bleeding disorders. Patient expressed understanding and wished to proceed. All questions were answered. Sterile technique was used throughout the entire procedure. Please see nursing notes for vital signs. Test dose was given through epidural catheter and negative prior to continuing to dose epidural or start infusion. Warning signs of high block given to the patient including shortness of breath, tingling/numbness in hands, complete motor  block, or any concerning symptoms with instructions to call for help. Patient was given instructions on fall risk and not to get out of bed. All questions and concerns addressed with instructions to call with any issues or inadequate analgesia.  Reason for block:procedure for pain

## 2018-02-28 NOTE — Progress Notes (Signed)
OB PN:  S: Pt resting comfortably, no acute complaints  O: BP (!) 103/54   Pulse 85   Temp 97.9 F (36.6 C) (Oral)   Resp 18   Ht 5\' 3"  (1.6 m)   Wt 71.7 kg   LMP 05/15/2017   SpO2 97%   BMI 27.99 kg/m   FHT: 140bpm, moderate variablity, + accels, no decels Toco: q1-42min SVE: deferred, per RN 9/C/+1  A/P: 32 y.o. G1P0 @ [redacted]w[redacted]d for labor 1. FWB: Cat. I 2. Labor: expectant managemetn Pain: continue epidural GBS: negative  Janyth Pupa, DO [phone] (cell) [phone] (office)

## 2018-03-01 LAB — CBC
HCT: 29.4 % — ABNORMAL LOW (ref 36.0–46.0)
Hemoglobin: 10 g/dL — ABNORMAL LOW (ref 12.0–15.0)
MCH: 31.1 pg (ref 26.0–34.0)
MCHC: 34 g/dL (ref 30.0–36.0)
MCV: 91.3 fL (ref 78.0–100.0)
PLATELETS: 174 10*3/uL (ref 150–400)
RBC: 3.22 MIL/uL — ABNORMAL LOW (ref 3.87–5.11)
RDW: 14.1 % (ref 11.5–15.5)
WBC: 13.3 10*3/uL — AB (ref 4.0–10.5)

## 2018-03-01 NOTE — Progress Notes (Signed)
MOB was referred for history of depression/anxiety. * Referral screened out by Clinical Social Worker because none of the following criteria appear to apply: ~ History of anxiety/depression during this pregnancy, or of post-partum depression following prior delivery. ~ Diagnosis of anxiety and/or depression within last 3 years OR * MOB's symptoms currently being treated with medication and/or therapy. Please contact the Clinical Social Worker if needs arise, by MOB request, or if MOB scores greater than 9/yes to question 10 on Edinburgh Postpartum Depression Screen.  Demitrus Francisco Boyd-Gilyard, MSW, LCSW Clinical Social Work [phone]  

## 2018-03-01 NOTE — Anesthesia Postprocedure Evaluation (Signed)
Anesthesia Post Note  Patient: Kathryn Bradley  Procedure(s) Performed: AN AD HOC LABOR EPIDURAL     Patient location during evaluation: Mother Baby Anesthesia Type: Epidural Level of consciousness: awake and alert Pain management: pain level controlled Vital Signs Assessment: post-procedure vital signs reviewed and stable Respiratory status: spontaneous breathing, nonlabored ventilation and respiratory function stable Cardiovascular status: stable Postop Assessment: no headache, no backache, epidural receding, patient able to bend at knees, no apparent nausea or vomiting, able to ambulate and adequate PO intake Anesthetic complications: no    Last Vitals:  Vitals:   02/28/18 2356 03/01/18 0607  BP: 104/60 (!) 102/54  Pulse: 72 66  Resp: 18 18  Temp: 36.6 C 36.9 C  SpO2:      Last Pain:  Vitals:   03/01/18 0607  TempSrc: Oral  PainSc: 0-No pain   Pain Goal: Patients Stated Pain Goal: 10 (02/28/18 0857)               Jabier Mutton

## 2018-03-01 NOTE — Progress Notes (Signed)
Postpartum Note Day # 1  S:  Patient resting comfortable in bed.  Pain controlled.  Tolerating general diet. + flatus, no BM.  Lochia moderate.  Ambulating without difficulty.  She denies n/v/f/c, SOB, or CP.  Pt plans on breastfeeding.  O: Temp:  [97.8 F (36.6 C)-98.7 F (37.1 C)] 98.4 F (36.9 C) (09/24 0607) Pulse Rate:  [66-126] 66 (09/24 0607) Resp:  [16-20] 18 (09/24 0607) BP: (92-130)/(39-90) 102/54 (09/24 0607) SpO2:  [97 %-99 %] 97 % (09/23 1421) Weight:  [71.7 kg] 71.7 kg (09/23 0708)   Gen: A&Ox3, NAD CV: RRR Resp: CTAB Abdomen: soft, NT, ND Uterus: firm, non-tender, below umbilicus Ext: No edema, no calf tenderness bilaterally  Labs:  Recent Labs    02/28/18 0800 03/01/18 0513  HGB 12.6 10.0*    A/P: Pt is a 32 y.o. G2P1011 s/p NSVD, PPD#1  - Pain well controlled -GU: voiding freely -GI: Tolerating general diet -Activity: encouraged sitting up to chair and ambulation as tolerated -Prophylaxis: early ambulation -Labs: stable as above Continue routine postpartum care  Janyth Pupa, DO [phone] (cell) [phone] (office)

## 2018-03-02 MED ORDER — BISACODYL 5 MG PO TBEC
5.0000 mg | DELAYED_RELEASE_TABLET | Freq: Every day | ORAL | Status: DC | PRN
  Administered 2018-03-02: 5 mg via ORAL
  Filled 2018-03-02 (×2): qty 1

## 2018-03-02 MED ORDER — IBUPROFEN 600 MG PO TABS: 600.0000 mg | ORAL_TABLET | Freq: Four times a day (QID) | ORAL | 0 refills | Status: DC

## 2018-03-02 MED ORDER — DOCUSATE SODIUM 100 MG PO CAPS: 100.0000 mg | ORAL_CAPSULE | Freq: Two times a day (BID) | ORAL | 0 refills | Status: DC

## 2018-03-02 NOTE — Discharge Summary (Signed)
OB Discharge Summary     Patient Name: Kathryn Bradley DOB: June 22, 1985 MRN: [id number]  Date of admission: 02/28/2018 Delivering MD: Janyth Pupa   Date of discharge: 03/02/2018  Admitting diagnosis: 40WKS,LABOR Intrauterine pregnancy: [redacted]w[redacted]d     Secondary diagnosis:  Active Problems:   Labor and delivery, indication for care  Additional problems: none     Discharge diagnosis: Term Pregnancy Delivered                                                                                                Post partum procedures:none  Augmentation: AROM  Complications: None  Hospital course:  Onset of Labor With Vaginal Delivery     32 y.o. yo G2P1011 at [redacted]w[redacted]d was admitted in Latent Labor on 02/28/2018. Patient had an uncomplicated labor course as follows:  Membrane Rupture Time/Date: 1:04 PM ,02/28/2018   Intrapartum Procedures: Episiotomy: Right Mediolateral [4]                                         Lacerations:  3rd degree [4]  Patient had a delivery of a Viable infant. 02/28/2018  Information for the patient's newborn:  Tiann, Saha Girl Danne [[id number]]  Delivery Method: Vag-Spont    Pateint had an uncomplicated postpartum course.  She is ambulating, tolerating a regular diet, passing flatus, and urinating well. Patient is discharged home in stable condition on 03/02/18.   Physical exam  Vitals:   03/01/18 1028 03/01/18 1436 03/01/18 2347 03/02/18 0632  BP: 102/61 (!) 103/58 113/68 114/65  Pulse: 85 74 82 62  Resp: 20 18  18   Temp: 98.5 F (36.9 C) 98 F (36.7 C)  97.9 F (36.6 C)  TempSrc: Oral Oral  Oral  SpO2: 99% 99%    Weight:      Height:       General: alert, cooperative and no distress  CV: RRR Lungs: CTAB Lochia: appropriate Uterine Fundus: firm Incision: N/A DVT Evaluation: No evidence of DVT seen on physical exam. Labs: Lab Results  Component Value Date   WBC 13.3 (H) 03/01/2018   HGB 10.0 (L) 03/01/2018   HCT 29.4 (L) 03/01/2018   MCV 91.3 03/01/2018   PLT  174 03/01/2018   No flowsheet data found.  Discharge instruction: per After Visit Summary and "Baby and Me Booklet".  After visit meds:  Allergies as of 03/02/2018      Reactions   Tape Rash      Medication List    TAKE these medications   ALLEGRA PO Take 1 tablet by mouth daily.   docusate sodium 100 MG capsule Commonly known as:  COLACE Take 1 capsule (100 mg total) by mouth 2 (two) times daily.   ibuprofen 600 MG tablet Commonly known as:  ADVIL,MOTRIN Take 1 tablet (600 mg total) by mouth every 6 (six) hours.   IRON PO Take 1 tablet by mouth daily.   PRENATAL VITAMINS PO Take 1 tablet by mouth daily. Multivitamin/multimineral with folic acid   ranitidine 150  MG tablet Commonly known as:  ZANTAC Take 150 mg by mouth daily.       Diet: routine diet  Activity: Advance as tolerated. Pelvic rest for 6 weeks.   Outpatient follow up:6 weeks Follow up Appt:No future appointments. Follow up Visit:No follow-ups on file.  Postpartum contraception: Condoms  Newborn Data: Live born female  Birth Weight: 8 lb 9 oz (3884 g) APGAR: 8, 9  Newborn Delivery   Birth date/time:  02/28/2018 20:53:00 Delivery type:  Vaginal, Spontaneous     Baby Feeding: Bottle and Breast Disposition:home with mother   03/02/2018 Annalee Genta, DO

## 2018-03-02 NOTE — Discharge Instructions (Signed)
Vaginal Delivery, Care After °Refer to this sheet in the next few weeks. These instructions provide you with information about caring for yourself after vaginal delivery. Your health care provider may also give you more specific instructions. Your treatment has been planned according to current medical practices, but problems sometimes occur. Call your health care provider if you have any problems or questions. °What can I expect after the procedure? °After vaginal delivery, it is common to have: °· Some bleeding from your vagina. °· Soreness in your abdomen, your vagina, and the area of skin between your vaginal opening and your anus (perineum). °· Pelvic cramps. °· Fatigue. ° °Follow these instructions at home: °Medicines °· Take over-the-counter and prescription medicines only as told by your health care provider. °· If you were prescribed an antibiotic medicine, take it as told by your health care provider. Do not stop taking the antibiotic until it is finished. °Driving ° °· Do not drive or operate heavy machinery while taking prescription pain medicine. °· Do not drive for 24 hours if you received a sedative. °Lifestyle °· Do not drink alcohol. This is especially important if you are breastfeeding or taking medicine to relieve pain. °· Do not use tobacco products, including cigarettes, chewing tobacco, or e-cigarettes. If you need help quitting, ask your health care provider. °Eating and drinking °· Drink at least 8 eight-ounce glasses of water every day unless you are told not to by your health care provider. If you choose to breastfeed your baby, you may need to drink more water than this. °· Eat high-fiber foods every day. These foods may help prevent or relieve constipation. High-fiber foods include: °? Whole grain cereals and breads. °? Brown rice. °? Beans. °? Fresh fruits and vegetables. °Activity °· Return to your normal activities as told by your health care provider. Ask your health care provider  what activities are safe for you. °· Rest as much as possible. Try to rest or take a nap when your baby is sleeping. °· Do not lift anything that is heavier than your baby or 10 lb (4.5 kg) until your health care provider says that it is safe. °· Talk with your health care provider about when you can engage in sexual activity. This may depend on your: °? Risk of infection. °? Rate of healing. °? Comfort and desire to engage in sexual activity. °Vaginal Care °· If you have an episiotomy or a vaginal tear, check the area every day for signs of infection. Check for: °? More redness, swelling, or pain. °? More fluid or blood. °? Warmth. °? Pus or a bad smell. °· Do not use tampons or douches until your health care provider says this is safe. °· Watch for any blood clots that may pass from your vagina. These may look like clumps of dark red, brown, or black discharge. °General instructions °· Keep your perineum clean and dry as told by your health care provider. °· Wear loose, comfortable clothing. °· Wipe from front to back when you use the toilet. °· Ask your health care provider if you can shower or take a bath. If you had an episiotomy or a perineal tear during labor and delivery, your health care provider may tell you not to take baths for a certain length of time. °· Wear a bra that supports your breasts and fits you well. °· If possible, have someone help you with household activities and help care for your baby for at least a few days after   you leave the hospital. °· Keep all follow-up visits for you and your baby as told by your health care provider. This is important. °Contact a health care provider if: °· You have: °? Vaginal discharge that has a bad smell. °? Difficulty urinating. °? Pain when urinating. °? A sudden increase or decrease in the frequency of your bowel movements. °? More redness, swelling, or pain around your episiotomy or vaginal tear. °? More fluid or blood coming from your episiotomy or  vaginal tear. °? Pus or a bad smell coming from your episiotomy or vaginal tear. °? A fever. °? A rash. °? Little or no interest in activities you used to enjoy. °? Questions about caring for yourself or your baby. °· Your episiotomy or vaginal tear feels warm to the touch. °· Your episiotomy or vaginal tear is separating or does not appear to be healing. °· Your breasts are painful, hard, or turn red. °· You feel unusually sad or worried. °· You feel nauseous or you vomit. °· You pass large blood clots from your vagina. If you pass a blood clot from your vagina, save it to show to your health care provider. Do not flush blood clots down the toilet without having your health care provider look at them. °· You urinate more than usual. °· You are dizzy or light-headed. °· You have not breastfed at all and you have not had a menstrual period for 12 weeks after delivery. °· You have stopped breastfeeding and you have not had a menstrual period for 12 weeks after you stopped breastfeeding. °Get help right away if: °· You have: °? Pain that does not go away or does not get better with medicine. °? Chest pain. °? Difficulty breathing. °? Blurred vision or spots in your vision. °? Thoughts about hurting yourself or your baby. °· You develop pain in your abdomen or in one of your legs. °· You develop a severe headache. °· You faint. °· You bleed from your vagina so much that you fill two sanitary pads in one hour. °This information is not intended to replace advice given to you by your health care provider. Make sure you discuss any questions you have with your health care provider. °Document Released: 05/22/2000 Document Revised: 11/06/2015 Document Reviewed: 06/09/2015 °[employer] Interactive Patient Education © 2018 [employer] ° °

## 2018-03-04 ENCOUNTER — Inpatient Hospital Stay (HOSPITAL_COMMUNITY): Admission: RE | Admit: 2018-03-04 | Payer: 59 | Source: Ambulatory Visit

## 2018-03-17 DIAGNOSIS — R300 Dysuria: Secondary | ICD-10-CM | POA: Diagnosis not present

## 2018-10-04 ENCOUNTER — Other Ambulatory Visit: Payer: Self-pay | Admitting: Student

## 2018-10-04 DIAGNOSIS — R1033 Periumbilical pain: Secondary | ICD-10-CM

## 2018-10-06 ENCOUNTER — Other Ambulatory Visit: Payer: Self-pay | Admitting: Student

## 2018-10-10 ENCOUNTER — Other Ambulatory Visit: Payer: Self-pay

## 2018-10-10 ENCOUNTER — Ambulatory Visit
Admission: RE | Admit: 2018-10-10 | Discharge: 2018-10-10 | Disposition: A | Discharge status: Home / Self Care | Payer: Commercial Managed Care - PPO | Source: Ambulatory Visit | Attending: Student | Admitting: Student

## 2018-10-10 DIAGNOSIS — R1033 Periumbilical pain: Secondary | ICD-10-CM

## 2018-10-10 MED ORDER — IOPAMIDOL (ISOVUE-300) INJECTION 61%
100.0000 mL | Freq: Once | INTRAVENOUS | Status: AC | PRN
  Administered 2018-10-10: 100 mL via INTRAVENOUS

## 2019-05-10 ENCOUNTER — Other Ambulatory Visit: Payer: Self-pay

## 2019-05-10 DIAGNOSIS — Z20822 Contact with and (suspected) exposure to covid-19: Secondary | ICD-10-CM

## 2019-05-13 LAB — NOVEL CORONAVIRUS, NAA: SARS-CoV-2, NAA: NOT DETECTED

## 2019-06-08 ENCOUNTER — Ambulatory Visit: Payer: Commercial Managed Care - PPO | Attending: Internal Medicine

## 2019-06-08 DIAGNOSIS — Z20822 Contact with and (suspected) exposure to covid-19: Secondary | ICD-10-CM

## 2019-06-10 LAB — NOVEL CORONAVIRUS, NAA: SARS-CoV-2, NAA: NOT DETECTED

## 2020-02-22 ENCOUNTER — Other Ambulatory Visit: Payer: Self-pay | Admitting: Physician Assistant

## 2020-02-22 DIAGNOSIS — R1031 Right lower quadrant pain: Secondary | ICD-10-CM

## 2020-02-26 ENCOUNTER — Other Ambulatory Visit: Payer: Self-pay | Admitting: Physician Assistant

## 2020-02-26 DIAGNOSIS — R2242 Localized swelling, mass and lump, left lower limb: Secondary | ICD-10-CM

## 2020-02-28 ENCOUNTER — Ambulatory Visit
Admission: RE | Admit: 2020-02-28 | Discharge: 2020-02-28 | Disposition: A | Discharge status: Home / Self Care | Payer: Commercial Managed Care - PPO | Source: Ambulatory Visit | Attending: Physician Assistant | Admitting: Physician Assistant

## 2020-02-28 ENCOUNTER — Other Ambulatory Visit: Payer: Commercial Managed Care - PPO

## 2020-02-28 DIAGNOSIS — R2242 Localized swelling, mass and lump, left lower limb: Secondary | ICD-10-CM

## 2020-03-01 ENCOUNTER — Other Ambulatory Visit: Payer: Commercial Managed Care - PPO

## 2020-03-06 ENCOUNTER — Other Ambulatory Visit: Payer: Self-pay | Admitting: Physician Assistant

## 2020-03-06 DIAGNOSIS — R2242 Localized swelling, mass and lump, left lower limb: Secondary | ICD-10-CM

## 2020-03-07 ENCOUNTER — Ambulatory Visit
Admission: RE | Admit: 2020-03-07 | Discharge: 2020-03-07 | Disposition: A | Discharge status: Home / Self Care | Payer: Commercial Managed Care - PPO | Source: Ambulatory Visit | Attending: Physician Assistant | Admitting: Physician Assistant

## 2020-03-07 DIAGNOSIS — R1031 Right lower quadrant pain: Secondary | ICD-10-CM

## 2020-03-09 ENCOUNTER — Other Ambulatory Visit: Payer: Self-pay

## 2020-03-09 ENCOUNTER — Ambulatory Visit
Admission: RE | Admit: 2020-03-09 | Discharge: 2020-03-09 | Disposition: A | Discharge status: Home / Self Care | Payer: Commercial Managed Care - PPO | Source: Ambulatory Visit | Attending: Physician Assistant | Admitting: Physician Assistant

## 2020-03-09 DIAGNOSIS — R2242 Localized swelling, mass and lump, left lower limb: Secondary | ICD-10-CM

## 2020-03-14 ENCOUNTER — Other Ambulatory Visit: Payer: Self-pay | Admitting: Physician Assistant

## 2020-03-14 DIAGNOSIS — R2242 Localized swelling, mass and lump, left lower limb: Secondary | ICD-10-CM

## 2020-03-15 ENCOUNTER — Other Ambulatory Visit: Payer: Self-pay

## 2020-03-15 ENCOUNTER — Ambulatory Visit
Admission: RE | Admit: 2020-03-15 | Discharge: 2020-03-15 | Disposition: A | Discharge status: Home / Self Care | Payer: Commercial Managed Care - PPO | Source: Ambulatory Visit | Attending: Physician Assistant | Admitting: Physician Assistant

## 2020-03-15 DIAGNOSIS — R2242 Localized swelling, mass and lump, left lower limb: Secondary | ICD-10-CM

## 2020-03-15 MED ORDER — GADOBENATE DIMEGLUMINE 529 MG/ML IV SOLN
13.0000 mL | Freq: Once | INTRAVENOUS | Status: AC | PRN
  Administered 2020-03-15: 13 mL via INTRAVENOUS

## 2020-04-18 ENCOUNTER — Telehealth: Payer: Self-pay | Admitting: Genetic Counselor

## 2020-04-18 NOTE — Telephone Encounter (Signed)
Received a genetic counseling referral from Dr. Delora Fuel for fhx of breast cancer. Kathryn Bradley returned my call and has been scheduled to see [name] on 12/1 at [place]. Pt aware to arrive 20 minutes early.

## 2020-05-07 ENCOUNTER — Encounter: Payer: Self-pay | Admitting: Genetic Counselor

## 2020-05-08 ENCOUNTER — Inpatient Hospital Stay: Payer: Commercial Managed Care - PPO

## 2020-05-08 ENCOUNTER — Inpatient Hospital Stay: Payer: Commercial Managed Care - PPO | Admitting: Genetic Counselor

## 2020-06-05 ENCOUNTER — Other Ambulatory Visit: Payer: Self-pay | Admitting: Genetic Counselor

## 2020-06-05 ENCOUNTER — Inpatient Hospital Stay: Payer: Commercial Managed Care - PPO

## 2020-06-05 ENCOUNTER — Other Ambulatory Visit: Payer: Self-pay

## 2020-06-05 ENCOUNTER — Encounter: Payer: Self-pay | Admitting: Genetic Counselor

## 2020-06-05 ENCOUNTER — Inpatient Hospital Stay: Payer: Commercial Managed Care - PPO | Attending: Genetic Counselor | Admitting: Genetic Counselor

## 2020-06-05 DIAGNOSIS — Z803 Family history of malignant neoplasm of breast: Secondary | ICD-10-CM | POA: Diagnosis not present

## 2020-06-05 DIAGNOSIS — Z8041 Family history of malignant neoplasm of ovary: Secondary | ICD-10-CM | POA: Diagnosis not present

## 2020-06-05 DIAGNOSIS — Z800 Family history of malignant neoplasm of digestive organs: Secondary | ICD-10-CM | POA: Insufficient documentation

## 2020-06-05 LAB — GENETIC SCREENING ORDER

## 2020-06-05 NOTE — Progress Notes (Signed)
REFERRING PROVIDER: Drema Dallas, DO [address] [place],  [place] [address]  PRIMARY PROVIDER:  Scifres, Dorothy, PA-C  PRIMARY REASON FOR VISIT:  1. Family history of breast cancer   2. Family history of ovarian cancer   3. Family history of colon cancer      HISTORY OF PRESENT ILLNESS:   Ms. Bence, a 34 y.o. female, was seen for a [place] cancer genetics consultation at the request of Dr. Delora Fuel due to a family history of breast, ovarian, kidney and colon cancer.  Ms. Glasco presents to clinic today to discuss the possibility of a hereditary predisposition to cancer, genetic testing, and to further clarify her future cancer risks, as well as potential cancer risks for family members.   Ms. Rosenwald is a 34 y.o. female with no personal history of cancer.  She is concerned about her risk for breast cancer and desires to learn about genetic testing.  CANCER HISTORY:  Oncology History   No history exists.     RISK FACTORS:  Menarche was at age 45.  First live birth at age 9.  OCP use for approximately <5 years.  Ovaries intact: yes.  Hysterectomy: no.  Menopausal status: premenopausal.  HRT use: 0 years. Colonoscopy: no; not examined. Mammogram within the last year: no. Number of breast biopsies: 0. Up to date with pelvic exams: yes. Any excessive radiation exposure in the past: no  Past Medical History:  Diagnosis Date  . Abdominal pain   . Allergic rhinitis   . Anxiety   . Asthma   . Depression   . Esophageal reflux   . Family history of breast cancer   . Family history of colon cancer   . Family history of ovarian cancer   . Lactose intolerance   . Migraine headache     Past Surgical History:  Procedure Laterality Date  . WISDOM TOOTH EXTRACTION  2005    Social History   Socioeconomic History  . Marital status: Married    Spouse name: Not on file  . Number of children: Not on file  . Years of education: Not on file  . Highest education  level: Associate degree: academic program  Occupational History  . Not on file  Tobacco Use  . Smoking status: Never Smoker  . Smokeless tobacco: Never Used  Vaping Use  . Vaping Use: Never used  Substance and Sexual Activity  . Alcohol use: No  . Drug use: No  . Sexual activity: Yes  Other Topics Concern  . Not on file  Social History Narrative   She is currently [redacted] weeks pregnant with her first child (08/24/17).    Lives at home with her husband   Right handed   No caffeine   Social Determinants of Radio broadcast assistant Strain: Not on file  Food Insecurity: Not on file  Transportation Needs: Not on file  Physical Activity: Not on file  Stress: Not on file  Social Connections: Not on file     FAMILY HISTORY:  We obtained a detailed, 4-generation family history.  Significant diagnoses are listed below: Family History  Problem Relation Age of Onset  . Breast cancer Mother 66       triple negative  . Heart attack Mother   . Heart disease Mother   . Diabetes Mother   . Heart Problems Father 75  . Heart disease Father   . Diabetes Maternal Grandmother   . Ovarian cancer Maternal Grandmother   .  Macular degeneration Maternal Grandmother   . Colon cancer Maternal Grandmother   . Kidney cancer Maternal Grandfather   . Bladder Cancer Maternal Grandfather   . Skin cancer Maternal Grandfather   . Diabetes Maternal Grandfather   . Breast cancer Cousin        dx late 70s, mat first cousin  . Macular degeneration Maternal Aunt     The patient has one daughter who is cancer free.  She has one full sister and one maternal half sister who are cancer free.  Both parents are deceased.  The patient's mother had triple negative breast cancer in her early to mid 39's. She has three brothers and a sister who are cancer free. The sister has a daughter who had breast cancer in her late 64's.  The maternal grandparents are deceased.  The grandmother had ovarian and colon cancer  and the grandfather had kidney/bladder cancer.  The patient's father died of a heart attack at 59.  He was adopted.  Ms. Kinkade is unaware of previous family history of genetic testing for hereditary cancer risks. Patient's maternal ancestors are of Polish/Eastern European descent, and paternal ancestors are of Caucasian descent. There is no reported Ashkenazi Jewish ancestry. There is no known consanguinity.  GENETIC COUNSELING ASSESSMENT: Ms. Willard is a 34 y.o. female with a family history of cancer which is somewhat suggestive of a hereditary cancer syndrome and predisposition to cancer given the multiple generations, combination of cancer and young ages of onset. We, therefore, discussed and recommended the following at today's visit.   DISCUSSION: We discussed that 5 - 10% of cancer is hereditary, with each type of cancer having its own risk for being hereditary.  About 5-10% of breast cancer is hereditary where up to 20% of ovarian cancer is hereditary, most cases associated with BRCA mutations for both of these cancers.  There are other genes that can be associated with hereditary breast cancer syndromes.  These include ATM, CHEK2 and PALB2.  We discussed that testing is beneficial for several reasons including knowing how to follow individuals and understand if other family members could be at risk for cancer and allow them to undergo genetic testing.   We reviewed the characteristics, features and inheritance patterns of hereditary cancer syndromes. We also discussed genetic testing, including the appropriate family members to test, the process of testing, insurance coverage and turn-around-time for results. We discussed the implications of a negative, positive, carrier and/or variant of uncertain significant result. We recommended Ms. Kirtley pursue genetic testing for the CancerNext-Expanded+RNAinsight gene panel. The CancerNext-Expanded gene panel offered by [facility] and includes sequencing and  rearrangement analysis for the following 77 genes: AIP, ALK, APC*, ATM*, AXIN2, BAP1, BARD1, BLM, BMPR1A, BRCA1*, BRCA2*, BRIP1*, CDC73, CDH1*, CDK4, CDKN1B, CDKN2A, CHEK2*, CTNNA1, DICER1, FANCC, FH, FLCN, GALNT12, KIF1B, LZTR1, MAX, MEN1, MET, MLH1*, MSH2*, MSH3, MSH6*, MUTYH*, NBN, NF1*, NF2, NTHL1, PALB2*, PHOX2B, PMS2*, POT1, PRKAR1A, PTCH1, PTEN*, RAD51C*, RAD51D*, RB1, RECQL, RET, SDHA, SDHAF2, SDHB, SDHC, SDHD, SMAD4, SMARCA4, SMARCB1, SMARCE1, STK11, SUFU, TMEM127, TP53*, TSC1, TSC2, VHL and XRCC2 (sequencing and deletion/duplication); EGFR, EGLN1, HOXB13, KIT, MITF, PDGFRA, POLD1, and POLE (sequencing only); EPCAM and GREM1 (deletion/duplication only). DNA and RNA analyses performed for * genes.   Based on Ms. Matto's family history of cancer, she meets medical criteria for genetic testing. Despite that she meets criteria, she may still have an out of pocket cost. We discussed that if her out of pocket cost for testing is over $100, the laboratory  will call and confirm whether she wants to proceed with testing.  If the out of pocket cost of testing is less than $100 she will be billed by the genetic testing laboratory.   Based on Ms. Berthold's family history of cancer, she meets medical criteria for genetic testing. Despite that she meets criteria, she may still have an out of pocket cost.   Based on the patient's family history, a statistical model (Tyrer Cusik) was used to estimate her risk of developing breast cancer. This estimates her lifetime risk of developing breast cancer to be approximately 25%. This estimation does not consider any genetic testing results.  The patient's lifetime breast cancer risk is a preliminary estimate based on available information using one of several models endorsed by the [employer] (ACS). The ACS recommends consideration of breast MRI screening as an adjunct to mammography for patients at high risk (defined as 20% or greater lifetime risk). Please note  that a woman's breast cancer risk changes over time. It may increase or decrease based on age and any changes to the personal and/or family medical history. The risks and recommendations listed above apply to this patient at this point in time. In the future, she may or may not be eligible for the same medical management strategies and, in some cases, other medical management strategies may become available to her. If she is interested in an updated breast cancer risk assessment at a later date, she can contact us.  Ms. Ey has been determined to be at high risk for breast cancer.  Therefore, we recommend that annual screening with mammography and breast MRI be performed.  We discussed that Ms. Morea should discuss her individual situation with her referring physician and determine a breast cancer screening plan with which they are both comfortable.  Ms. Kerwood agreed to a referral to the [facility] High Risk Breast clinic.  We will make this referral after her test results are back.    PLAN: After considering the risks, benefits, and limitations, Ms. Glanz provided informed consent to pursue genetic testing and the blood sample was sent to [facility] for analysis of the CancerNext-Expanded+RNAinsight. Results should be available within approximately 2-3 weeks' time, at which point they will be disclosed by telephone to Ms. All, as will any additional recommendations warranted by these results. Ms. Amrein will receive a summary of her genetic counseling visit and a copy of her results once available. This information will also be available in Epic.   Lastly, we encouraged Ms. Isadore to remain in contact with cancer genetics annually so that we can continuously update the family history and inform her of any changes in cancer genetics and testing that may be of benefit for this family.   Ms. Laughner questions were answered to her satisfaction today. Our contact information was provided should additional  questions or concerns arise. Thank you for the referral and allowing [place] to share in the care of your patient.   Marlyn Tondreau P. Florene Glen, [place], [facility] Licensed, Insurance risk surveyor [name] phone: [phone]  The patient was seen for a total of 35 minutes in face-to-face genetic counseling.  This patient was discussed with Drs. Magrinat, Lindi Adie and/or Burr Medico who agrees with the above.    _______________________________________________________________________ For Office Staff:  Number of people involved in session: 1 Was an Intern/ student involved with case: no

## 2020-06-20 ENCOUNTER — Telehealth: Payer: Self-pay | Admitting: Genetic Counselor

## 2020-06-20 ENCOUNTER — Ambulatory Visit: Payer: Self-pay | Admitting: Genetic Counselor

## 2020-06-20 ENCOUNTER — Encounter: Payer: Self-pay | Admitting: Genetic Counselor

## 2020-06-20 DIAGNOSIS — Z1379 Encounter for other screening for genetic and chromosomal anomalies: Secondary | ICD-10-CM | POA: Insufficient documentation

## 2020-06-20 NOTE — Telephone Encounter (Signed)
I cld and lft the pt a vm to schedule an appt for the high risk clinic.

## 2020-06-20 NOTE — Progress Notes (Signed)
HPI:  Kathryn Bradley was previously seen in the [place] clinic due to a family history of breast and other cancers and concerns regarding a hereditary predisposition to cancer. Please refer to our prior cancer genetics clinic note for more information regarding our discussion, assessment and recommendations, at the time. Kathryn Bradley recent genetic test results were disclosed to her, as were recommendations warranted by these results. These results and recommendations are discussed in more detail below.  CANCER HISTORY:  Oncology History   No history exists.    FAMILY HISTORY:  We obtained a detailed, 4-generation family history.  Significant diagnoses are listed below: Family History  Problem Relation Age of Onset  . Breast cancer Mother 57       triple negative  . Heart attack Mother   . Heart disease Mother   . Diabetes Mother   . Heart Problems Father 71  . Heart disease Father   . Diabetes Maternal Grandmother   . Ovarian cancer Maternal Grandmother   . Macular degeneration Maternal Grandmother   . Colon cancer Maternal Grandmother   . Kidney cancer Maternal Grandfather   . Bladder Cancer Maternal Grandfather   . Skin cancer Maternal Grandfather   . Diabetes Maternal Grandfather   . Breast cancer Cousin        dx late 5s, mat first cousin  . Macular degeneration Maternal Aunt     The patient has one daughter who is cancer free.  She has one full sister and one maternal half sister who are cancer free.  Both parents are deceased.  The patient's mother had triple negative breast cancer in her early to mid 80's. She has three brothers and a sister who are cancer free. The sister has a daughter who had breast cancer in her late 5's.  The maternal grandparents are deceased.  The grandmother had ovarian and colon cancer and the grandfather had kidney/bladder cancer.  The patient's father died of a heart attack at 69.  He was adopted.  Kathryn Bradley is unaware of  previous family history of genetic testing for hereditary cancer risks. Patient's maternal ancestors are of Polish/Eastern European descent, and paternal ancestors are of Caucasian descent. There is no reported Ashkenazi Jewish ancestry. There is no known consanguinity.  GENETIC TEST RESULTS: Genetic testing reported out on June 19, 2020 through the CancerNext-Expanded+RNAinsight cancer panel found no pathogenic mutations. The CancerNext-Expanded gene panel offered by [facility] and includes sequencing and rearrangement analysis for the following 77 genes: AIP, ALK, APC*, ATM*, AXIN2, BAP1, BARD1, BLM, BMPR1A, BRCA1*, BRCA2*, BRIP1*, CDC73, CDH1*, CDK4, CDKN1B, CDKN2A, CHEK2*, CTNNA1, DICER1, FANCC, FH, FLCN, GALNT12, KIF1B, LZTR1, MAX, MEN1, MET, MLH1*, MSH2*, MSH3, MSH6*, MUTYH*, NBN, NF1*, NF2, NTHL1, PALB2*, PHOX2B, PMS2*, POT1, PRKAR1A, PTCH1, PTEN*, RAD51C*, RAD51D*, RB1, RECQL, RET, SDHA, SDHAF2, SDHB, SDHC, SDHD, SMAD4, SMARCA4, SMARCB1, SMARCE1, STK11, SUFU, TMEM127, TP53*, TSC1, TSC2, VHL and XRCC2 (sequencing and deletion/duplication); EGFR, EGLN1, HOXB13, KIT, MITF, PDGFRA, POLD1, and POLE (sequencing only); EPCAM and GREM1 (deletion/duplication only). DNA and RNA analyses performed for * genes. The test report has been scanned into EPIC and is located under the Molecular Pathology section of the Results Review tab.  A portion of the result report is included below for reference.     We discussed with Kathryn Bradley that because current genetic testing is not perfect, it is possible there may be a gene mutation in one of these genes that current testing cannot detect, but that chance is small.  We also discussed, that there could be another gene that has not yet been discovered, or that we have not yet tested, that is responsible for the cancer diagnoses in the family. It is also possible there is a hereditary cause for the cancer in the family that Kathryn Bradley did not inherit and therefore was not  identified in her testing.  Therefore, it is important to remain in touch with cancer genetics in the future so that we can continue to offer Kathryn Bradley the most up to date genetic testing.   ADDITIONAL GENETIC TESTING: We discussed with Kathryn Bradley that her genetic testing was fairly extensive.  If there are genes identified to increase cancer risk that can be analyzed in the future, we would be happy to discuss and coordinate this testing at that time.    CANCER SCREENING RECOMMENDATIONS: Kathryn Bradley test result is considered negative (normal).  This means that we have not identified a hereditary cause for her family history of cancer at this time. Most cancers happen by chance and this negative test suggests that her cancer may fall into this category.    While reassuring, this does not definitively rule out a hereditary predisposition to cancer. It is still possible that there could be genetic mutations that are undetectable by current technology. There could be genetic mutations in genes that have not been tested or identified to increase cancer risk.  Therefore, it is recommended she continue to follow the cancer management and screening guidelines provided by her oncology and primary healthcare provider.   An individual's cancer risk and medical management are not determined by genetic test results alone. Overall cancer risk assessment incorporates additional factors, including personal medical history, family history, and any available genetic information that may result in a personalized plan for cancer prevention and surveillance  Based on Kathryn Bradley's family of cancer, as well as her genetic test results, statistical models (Tyrer Cusik)  and literature data were used to estimate her risk of developing breast cancer. These estimate her lifetime risk of developing breast cancer to be approximately 25%.  The patient's lifetime breast cancer risk is a preliminary estimate based on available information  using one of several models endorsed by the [employer] (ACS). The ACS recommends consideration of breast MRI screening as an adjunct to mammography for patients at high risk (defined as 20% or greater lifetime risk). A more detailed breast cancer risk assessment can be considered, if clinically indicated.   We, therefore, discussed that it is reasonable for Kathryn Bradley to be followed by a high-risk breast cancer clinic; in addition to a yearly mammogram and physical exam by a healthcare provider, she should discuss the usefulness of an annual breast MRI with the high-risk clinic providers.  We will refer her to the high risk breast clinic at [facility].  RECOMMENDATIONS FOR FAMILY MEMBERS:  Individuals in this family might be at some increased risk of developing cancer, over the general population risk, simply due to the family history of cancer.  We recommended women in this family have a yearly mammogram beginning at age 5, or 55 years younger than the earliest onset of cancer, an annual clinical breast exam, and perform monthly breast self-exams. Women in this family should also have a gynecological exam as recommended by their primary provider. All family members should be referred for colonoscopy starting at age 24.  It is also possible there is a hereditary cause for the cancer in Kathryn Bradley family that she  did not inherit and therefore was not identified in her.  Based on Kathryn Bradley's family history, we recommended her maternal cousin, who was diagnosed with beast cancer in her 47's, have genetic counseling and testing. Kathryn Bradley will let us know if we can be of any assistance in coordinating genetic counseling and/or testing for this family member.   FOLLOW-UP: Lastly, we discussed with Kathryn Bradley that cancer genetics is a rapidly advancing field and it is possible that new genetic tests will be appropriate for her and/or her family members in the future. We encouraged her to remain in contact with  cancer genetics on an annual basis so we can update her personal and family histories and let her know of advances in cancer genetics that may benefit this family.   Our contact number was provided. Kathryn Bradley questions were answered to her satisfaction, and she knows she is welcome to call us at anytime with additional questions or concerns.   Roma Kayser, [place], [facility] Licensed, Certified Genetic Counselor [name]

## 2020-06-20 NOTE — Telephone Encounter (Signed)
Revealed negative genetic testing.  Discussed that we do not know why there is cancer in the family. It could be due to a different gene that we are not testing, or maybe our current technology may not be able to pick something up.  It will be important for her to keep in contact with genetics to keep up with whether additional testing may be needed.  

## 2020-06-20 NOTE — Telephone Encounter (Signed)
LM on VM that results are back and to please call. 

## 2020-06-20 NOTE — Telephone Encounter (Signed)
-----   Message from Clarene Essex, Counselor sent at 06/20/2020 11:40 AM EST ----- Negative genetic testing.  Pt at high risk for breast cancer.  Referring to the high risk breast clinic.

## 2020-07-25 ENCOUNTER — Telehealth: Payer: Self-pay | Admitting: Hematology and Oncology

## 2020-07-25 NOTE — Telephone Encounter (Signed)
Kathryn Bradley has cld to schedule an appt to be seen in the high risk clinic. An appt has been scheduled to see [name] on 3/3 at 10am. Pt aware to arrive 20 minutes early.

## 2020-08-08 ENCOUNTER — Telehealth: Payer: Self-pay | Admitting: Hematology and Oncology

## 2020-08-08 ENCOUNTER — Inpatient Hospital Stay: Payer: Commercial Managed Care - PPO | Admitting: Hematology and Oncology

## 2020-08-08 NOTE — Telephone Encounter (Signed)
Pt has been rescheduled to see Dr. Chryl Heck to 3/31 at 10am.

## 2020-09-05 ENCOUNTER — Other Ambulatory Visit: Payer: Self-pay

## 2020-09-05 ENCOUNTER — Encounter: Payer: Self-pay | Admitting: Hematology and Oncology

## 2020-09-05 ENCOUNTER — Inpatient Hospital Stay: Payer: Commercial Managed Care - PPO | Attending: Hematology and Oncology | Admitting: Hematology and Oncology

## 2020-09-05 VITALS — BP 102/69 | HR 81 | Temp 97.9°F | Resp 18 | Ht 65.0 in | Wt 137.3 lb

## 2020-09-05 DIAGNOSIS — Z9189 Other specified personal risk factors, not elsewhere classified: Secondary | ICD-10-CM | POA: Insufficient documentation

## 2020-09-05 DIAGNOSIS — Z8041 Family history of malignant neoplasm of ovary: Secondary | ICD-10-CM | POA: Insufficient documentation

## 2020-09-05 DIAGNOSIS — Z803 Family history of malignant neoplasm of breast: Secondary | ICD-10-CM | POA: Diagnosis not present

## 2020-09-05 NOTE — Addendum Note (Signed)
Addended by: Adaline Sill on: 09/05/2020 11:02 AM   Modules accepted: Orders

## 2020-09-05 NOTE — Progress Notes (Signed)
[facility] CONSULT NOTE  Patient Care Team: Scifres, Durel Salts as PCP - General (Physician Assistant)  CHIEF COMPLAINTS/PURPOSE OF CONSULTATION:  High risk breast cancer clinic.  ASSESSMENT & PLAN:  No problem-specific Assessment & Plan notes found for this encounter.  No orders of the defined types were placed in this encounter.  1.  Family history of breast cancer in mother and maternal cousin She had genetic testing and has no identifiable pathogenic mutations.  Her lifetime risk of breast cancer per [employer] model is around 27%.  Hence she was referred to high-risk breast clinic for further follow-up. She is quite healthy at baseline, denies any health complaints except for allergies. Physical examination today including breast exam quite unremarkable. We have confirmed that her lifetime risk of breast cancer based on TC more recently over 20% and hence we have discussed about imaging with MRIs and mammograms but since she is only 89 we have discussed about considering her first mammogram at age 59 and may be we can start serial mammograms at the age of 39 alternating with an MRI. We have discussed that the long-term risks of gadolinium deposition from subsequent MRIs is not clear.  She understands that an MRI can be very sensitive and can sometimes lead to unnecessary biopsies and false positive results. She is agreeable for follow-up with high-risk breast cancer clinic.  She will return to clinic in about 6 months. We have also discussed about lifestyle modifications including maintaining a healthy diet, over body mass index, minimizing alcohol intake and regular exercise.  Thank you for consulting [place] in the care of this patient.  Please not hesitate to contact us with any additional questions or concerns.  HISTORY OF PRESENTING ILLNESS:  Kathryn Bradley 35 y.o. female is here because of Life time risk of BC per TC model to be about 25%  This is a very pleasant  35 year old female patient who is very healthy referred to our high-risk breast cancer clinic given her family history of breast cancer.  Kathryn Bradley is here for an additional visit by herself.  She has significant family history of breast cancer in her mom who had bilateral breast cancer, breast cancer at the age of 98 in the setting of breast cancer at the age of 81 which was triple negative and she died from breast cancer at the age of 57.  Kathryn Bradley also has a maternal cousin who had breast cancer in her mid 53s.  Maternal grandmother had ovarian cancer.  Ms./personally has never had any breast biopsies or any known breast abnormalities.  She is healthy at baseline, has never had a mammogram but does self breast exam and denies any breast findings.  She denies any birth control usage.  Rest of the pertinent 10 point ROS reviewed and negative  Age of menarche: 49 Age at first pregnancy: 63 No birth control  REVIEW OF SYSTEMS:   Constitutional: Denies fevers, chills or abnormal night sweats Eyes: Denies blurriness of vision, double vision or watery eyes Ears, nose, mouth, throat, and face: Denies mucositis or sore throat Respiratory: Denies cough, dyspnea or wheezes Cardiovascular: Denies palpitation, chest discomfort or lower extremity swelling Gastrointestinal:  Denies nausea, heartburn or change in bowel habits Skin: Denies abnormal skin rashes Lymphatics: Denies new lymphadenopathy or easy bruising Neurological:Denies numbness, tingling or new weaknesses Behavioral/Psych: Mood is stable, no new changes  All other systems were reviewed with the patient and are negative.  MEDICAL HISTORY:  Past Medical History:  Diagnosis Date  . Abdominal pain   . Allergic rhinitis   . Anxiety   . Asthma   . Depression   . Esophageal reflux   . Family history of breast cancer   . Family history of colon cancer   . Family history of ovarian cancer   . Lactose intolerance   . Migraine headache      SURGICAL HISTORY: Past Surgical History:  Procedure Laterality Date  . WISDOM TOOTH EXTRACTION  2005    SOCIAL HISTORY: Social History   Socioeconomic History  . Marital status: Married    Spouse name: Not on file  . Number of children: Not on file  . Years of education: Not on file  . Highest education level: Associate degree: academic program  Occupational History  . Not on file  Tobacco Use  . Smoking status: Never Smoker  . Smokeless tobacco: Never Used  Vaping Use  . Vaping Use: Never used  Substance and Sexual Activity  . Alcohol use: No  . Drug use: No  . Sexual activity: Yes  Other Topics Concern  . Not on file  Social History Narrative      Lives at home with her husband   Right handed   No caffeine   Social Determinants of Radio broadcast assistant Strain: Not on file  Food Insecurity: Not on file  Transportation Needs: Not on file  Physical Activity: Not on file  Stress: Not on file  Social Connections: Not on file  Intimate Partner Violence: Not on file    FAMILY HISTORY: Family History  Problem Relation Age of Onset  . Breast cancer Mother 9       triple negative  . Heart attack Mother   . Heart disease Mother   . Diabetes Mother   . Heart Problems Father 61  . Heart disease Father   . Diabetes Maternal Grandmother   . Ovarian cancer Maternal Grandmother   . Macular degeneration Maternal Grandmother   . Colon cancer Maternal Grandmother   . Kidney cancer Maternal Grandfather   . Bladder Cancer Maternal Grandfather   . Skin cancer Maternal Grandfather   . Diabetes Maternal Grandfather   . Breast cancer Cousin        dx late 73s, mat first cousin  . Macular degeneration Maternal Aunt     ALLERGIES:  has No Known Allergies.  MEDICATIONS:  Current Outpatient Medications  Medication Sig Dispense Refill  . Fexofenadine HCl (ALLEGRA PO) Take 1 tablet by mouth daily.    Marland Kitchen docusate sodium (COLACE) 100 MG capsule Take 1 capsule  (100 mg total) by mouth 2 (two) times daily. 30 capsule 0  . ibuprofen (ADVIL,MOTRIN) 600 MG tablet Take 1 tablet (600 mg total) by mouth every 6 (six) hours. 30 tablet 0  . IRON PO Take 1 tablet by mouth daily.    . Prenatal Multivit-Min-Fe-FA (PRENATAL VITAMINS PO) Take 1 tablet by mouth daily. Multivitamin/multimineral with folic acid    . ranitidine (ZANTAC) 150 MG tablet Take 150 mg by mouth daily.     No current facility-administered medications for this visit.     PHYSICAL EXAMINATION:  ECOG PERFORMANCE STATUS: 0 - Asymptomatic  Vitals:   09/05/20 0945  BP: 102/69  Pulse: 81  Resp: 18  Temp: 97.9 F (36.6 C)  SpO2: 99%   Filed Weights   09/05/20 0945  Weight: 137 lb 4.8 oz (62.3 kg)    GENERAL:alert, no distress and comfortable SKIN: skin color,  texture, turgor are normal, no rashes or significant lesions EYES: normal, conjunctiva are pink and non-injected, sclera clear OROPHARYNX:no exudate, no erythema and lips, buccal mucosa, and tongue normal  NECK: supple, thyroid normal size, non-tender, without nodularity LYMPH:  no palpable lymphadenopathy in the cervical, axillary or inguinal LUNGS: clear to auscultation and percussion with normal breathing effort HEART: regular rate & rhythm and no murmurs and no lower extremity edema ABDOMEN:abdomen soft, non-tender and normal bowel sounds Musculoskeletal:no cyanosis of digits and no clubbing  PSYCH: alert & oriented x 3 with fluent speech NEURO: no focal motor/sensory deficits Breast exam: Bilateral breasts inspected and palpated.  No palpable masses or regional adenopathy.  LABORATORY DATA:  I have reviewed the data as listed Lab Results  Component Value Date   WBC 13.3 (H) 03/01/2018   HGB 10.0 (L) 03/01/2018   HCT 29.4 (L) 03/01/2018   MCV 91.3 03/01/2018   PLT 174 03/01/2018     Chemistry   No results found for: NA, K, CL, CO2, BUN, CREATININE, GLU No results found for: CALCIUM, ALKPHOS, AST, ALT, BILITOT     Lifetime risk of breast cancer per [employer] model based on personal history and family history is about 27%.   RADIOGRAPHIC STUDIES: I have personally reviewed the radiological images as listed and agreed with the findings in the report. No results found.  All questions were answered. The patient knows to call the clinic with any problems, questions or concerns. I spent 45 minutes in the care of this patient including H and P, review of records, counseling and coordination of care.     Benay Pike, MD 09/05/2020 10:14 AM

## 2020-09-23 ENCOUNTER — Encounter: Payer: Self-pay | Admitting: Allergy

## 2020-09-23 ENCOUNTER — Ambulatory Visit: Payer: Commercial Managed Care - PPO | Admitting: Allergy

## 2020-09-23 ENCOUNTER — Other Ambulatory Visit: Payer: Self-pay

## 2020-09-23 VITALS — BP 108/72 | HR 71 | Temp 98.2°F | Resp 18 | Ht 64.0 in | Wt 138.8 lb

## 2020-09-23 DIAGNOSIS — E739 Lactose intolerance, unspecified: Secondary | ICD-10-CM | POA: Diagnosis not present

## 2020-09-23 DIAGNOSIS — H1013 Acute atopic conjunctivitis, bilateral: Secondary | ICD-10-CM | POA: Diagnosis not present

## 2020-09-23 DIAGNOSIS — Z8709 Personal history of other diseases of the respiratory system: Secondary | ICD-10-CM | POA: Insufficient documentation

## 2020-09-23 DIAGNOSIS — J3089 Other allergic rhinitis: Secondary | ICD-10-CM | POA: Diagnosis not present

## 2020-09-23 MED ORDER — FLUTICASONE PROPIONATE 50 MCG/ACT NA SUSP: 1.0000 | Freq: Two times a day (BID) | NASAL | 5 refills | Status: DC | PRN

## 2020-09-23 NOTE — Patient Instructions (Addendum)
Today's skin testing showed: Positive to mold and cat.   Environmental allergies  Start environmental control measures as below.  May use over the counter antihistamines such as Allegra (fexofenadine) daily as needed.  May use Flonase (fluticasone) nasal spray 1 spray per nostril twice a day as needed for nasal congestion.   Nasal saline spray (i.e., Simply Saline) or nasal saline lavage (i.e., NeilMed) is recommended as needed and prior to medicated nasal sprays.  Read about allergy injections - handout given.   Breathing  Today's breathing test was normal.  Monitor symptoms.   Follow up in 2 months or sooner if needed.   Pet Allergen Avoidance: . Contrary to popular opinion, there are no "hypoallergenic" breeds of dogs or cats. That is because people are not allergic to an animal's hair, but to an allergen found in the animal's saliva, dander (dead skin flakes) or urine. Pet allergy symptoms typically occur within minutes. For some people, symptoms can build up and become most severe 8 to 12 hours after contact with the animal. People with severe allergies can experience reactions in public places if dander has been transported on the pet owners' clothing. Marland Kitchen Keeping an animal outdoors is only a partial solution, since homes with pets in the yard still have higher concentrations of animal allergens. . Before getting a pet, ask your allergist to determine if you are allergic to animals. If your pet is already considered part of your family, try to minimize contact and keep the pet out of the bedroom and other rooms where you spend a great deal of time. . As with dust mites, vacuum carpets often or replace carpet with a hardwood floor, tile or linoleum. . High-efficiency particulate air (HEPA) cleaners can reduce allergen levels over time. . While dander and saliva are the source of cat and dog allergens, urine is the source of allergens from rabbits, hamsters, mice and [place] pigs; so  ask a non-allergic family member to clean the animal's cage. . If you have a pet allergy, talk to your allergist about the potential for allergy immunotherapy (allergy shots). This strategy can often provide long-term relief.  Mold Control . Mold and fungi can grow on a variety of surfaces provided certain temperature and moisture conditions exist.  . Outdoor molds grow on plants, decaying vegetation and soil. The major outdoor mold, Alternaria and Cladosporium, are found in very high numbers during hot and dry conditions. Generally, a late summer - fall peak is seen for common outdoor fungal spores. Rain will temporarily lower outdoor mold spore count, but counts rise rapidly when the rainy period ends. . The most important indoor molds are Aspergillus and Penicillium. Dark, humid and poorly ventilated basements are ideal sites for mold growth. The next most common sites of mold growth are the bathroom and the kitchen. Outdoor (Seasonal) Mold Control . Use air conditioning and keep windows closed. . Avoid exposure to decaying vegetation. Marland Kitchen Avoid leaf raking. . Avoid grain handling. . Consider wearing a face mask if working in moldy areas.  Indoor (Perennial) Mold Control  . Maintain humidity below 50%. . Get rid of mold growth on hard surfaces with water, detergent and, if necessary, 5% bleach (do not mix with other cleaners). Then dry the area completely. If mold covers an area more than 10 square feet, consider hiring an indoor environmental professional. . For clothing, washing with soap and water is best. If moldy items cannot be cleaned and dried, throw them away. . Remove sources  e.g. contaminated carpets. . Repair and seal leaking roofs or pipes. Using dehumidifiers in damp basements may be helpful, but empty the water and clean units regularly to prevent mildew from forming. All rooms, especially basements, bathrooms and kitchens, require ventilation and cleaning to deter mold and mildew  growth. Avoid carpeting on concrete or damp floors, and storing items in damp areas.

## 2020-09-23 NOTE — Assessment & Plan Note (Signed)
Perennial rhinoconjunctivitis symptoms for 25 years.  Tried over-the-counter antihistamines with some benefit.  No prior allergy/ENT evaluation. 1 cat at home.   Today's skin testing showed: Positive to mold and cat.   Start environmental control measures as below.  May use over the counter antihistamines such as Allegra (fexofenadine) daily as needed.  May use Flonase (fluticasone) nasal spray 1 spray per nostril twice a day as needed for nasal congestion.   Nasal saline spray (i.e., Simply Saline) or nasal saline lavage (i.e., NeilMed) is recommended as needed and prior to medicated nasal sprays.  Read about allergy injections - handout given.

## 2020-09-23 NOTE — Progress Notes (Signed)
New Patient Note  RE: Kathryn Bradley MRN: [id number] DOB: 01-03-1986 Date of Office Visit: 09/23/2020  Consult requested by: Filiberto Pinks Primary care provider: Maude Leriche, PA-C  Chief Complaint: Allergy Testing  History of Present Illness: I had the pleasure of seeing Kathryn Bradley for initial evaluation at the [facility] on 09/23/2020. She is a 35 y.o. female, who is referred here by Scifres, Earlie Server, PA-C for the evaluation of allergic rhinitis.  She reports symptoms of nasal congestion, itchy ears/nose, throat clearing, rhinorrhea, itchy/watery eyes. Symptoms have been going on for 25 years. The symptoms are present all year around. Anosmia: no. Headache: yes. She has used Allegra, zyrtec, Claritin with some improvement in symptoms. Tried Flonase in the past with unknown benefit. Sinus infections: none. Previous work up includes: no. Previous ENT evaluation: no. Previous sinus imaging: no. History of nasal polyps: no. Last eye exam: within the past year. History of reflux: during pregnancy.  Assessment and Plan: Kathryn Bradley is a 35 y.o. female with: Other allergic rhinitis Perennial rhinoconjunctivitis symptoms for 25 years.  Tried over-the-counter antihistamines with some benefit.  No prior allergy/ENT evaluation. 1 cat at home.   Today's skin testing showed: Positive to mold and cat.   Start environmental control measures as below.  May use over the counter antihistamines such as Allegra (fexofenadine) daily as needed.  May use Flonase (fluticasone) nasal spray 1 spray per nostril twice a day as needed for nasal congestion.   Nasal saline spray (i.e., Simply Saline) or nasal saline lavage (i.e., NeilMed) is recommended as needed and prior to medicated nasal sprays.  Read about allergy injections - handout given.   Allergic conjunctivitis of both eyes  See assessment and plan as above for allergic rhinitis.  History of asthma History of asthma as  a child but never used her inhaler.  Sometimes has episodes of shortness of breath.  Today's spirometry was normal.  Declines albuterol Rx.  Monitor symptoms.   Return in about 2 months (around 11/23/2020).  Meds ordered this encounter  Medications  . fluticasone (FLONASE) 50 MCG/ACT nasal spray    Sig: Place 1 spray into both nostrils 2 (two) times daily as needed for allergies.    Dispense:  16 g    Refill:  5   Lab Orders  No laboratory test(s) ordered today    Other allergy screening: Asthma: yes  As a child but never used an inhaler.  Sometimes she does notice some shortness of breath.  No prior spirometry. Food allergy: no  Lactose intolerant.  Medication allergy: no Hymenoptera allergy: no Urticaria: no Eczema: yes as a child on the hands. History of recurrent infections suggestive of immunodeficency: no  Diagnostics: Spirometry:  Tracings reviewed. Her effort: Good reproducible efforts. FVC: 3.58L FEV1: 2.90L, 92% predicted FEV1/FVC ratio: 81% Interpretation: Spirometry consistent with normal pattern.  Please see scanned spirometry results for details.  Skin Testing: Environmental allergy panel. Positive test to: mold and cat. Results discussed with patient/family.  Airborne Adult Perc - 09/23/20 0859    Time Antigen Placed 0900    Allergen Manufacturer Lavella Hammock    Location Back    Number of Test 59    1. Control-Buffer 50% Glycerol 2+    2. Control-Histamine 1 mg/ml Negative    3. Albumin saline Negative    4. [place] Negative    5. [place] Negative    6. Johnson Negative    7. [place] Blue Negative    8. Victory Dakin  Fescue Negative    9. Perennial Rye Negative    10. Sweet Vernal Negative    11. Timothy Negative    12. Cocklebur Negative    13. Burweed Marshelder Negative    14. Ragweed, short Negative    15. Ragweed, Giant Negative    16. Plantain,  English Negative    17. Lamb's Quarters Negative    18. Sheep Sorrell Negative    19. Rough Pigweed  Negative    20. Marsh Elder, Rough Negative    21. Mugwort, Common Negative    22. Ash mix Negative    23. Birch mix Negative    24. Beech American Negative    25. Box, Elder Negative    26. Cedar, red Negative    27. Cottonwood, [place] Negative    28. Elm mix Negative    29. Hickory Negative    30. Maple mix Negative    31. Oak, [place] mix Negative    32. Pecan Pollen Negative    33. Pine mix Negative    34. Sycamore Eastern Negative    35. Beauregard, Black Pollen Negative    36. Alternaria alternata Negative    37. Cladosporium Herbarum Negative    38. Aspergillus mix Negative    39. Penicillium mix Negative    40. Bipolaris sorokiniana (Helminthosporium) Negative    41. Drechslera spicifera (Curvularia) Negative    42. Mucor plumbeus Negative    43. Fusarium moniliforme Negative    44. Aureobasidium pullulans (pullulara) Negative    45. Rhizopus oryzae Negative    46. Botrytis cinera Negative    47. Epicoccum nigrum Negative    48. Phoma betae Negative    49. Candida Albicans Negative    50. Trichophyton mentagrophytes Negative    51. Mite, D Farinae  5,000 AU/ml Negative    52. Mite, D Pteronyssinus  5,000 AU/ml Negative    53. Cat Hair 10,000 BAU/ml Negative    54.  Dog Epithelia Negative    55. Mixed Feathers Negative    56. Horse Epithelia Negative    57. Cockroach, German Negative    58. Mouse Negative    59. Tobacco Leaf Negative          Intradermal - 09/23/20 0937    Time Antigen Placed 1610    Location Arm    Number of Test 15    Control Negative    Gu[place] Negative    Johnson Negative    7 Grass Negative    Ragweed mix Negative    Weed mix Negative    Tree mix Negative    Mold 1 Negative    Mold 2 Negative    Mold 3 Negative    Mold 4 2+    Cat 2+    Dog Negative    Cockroach Negative    Mite mix Negative           Past Medical History: Patient Active Problem List   Diagnosis Date Noted  . Other allergic rhinitis 09/23/2020  .  History of asthma 09/23/2020  . Lactose intolerance 09/23/2020  . Allergic conjunctivitis of both eyes 09/23/2020  . Genetic testing 06/20/2020  . Family history of breast cancer   . Family history of ovarian cancer   . Family history of colon cancer   . Labor and delivery, indication for care 02/28/2018   Past Medical History:  Diagnosis Date  . Abdominal pain   . Allergic rhinitis   . Anxiety   .  Asthma   . Depression   . Esophageal reflux   . Family history of breast cancer   . Family history of colon cancer   . Family history of ovarian cancer   . Lactose intolerance   . Migraine headache    Past Surgical History: Past Surgical History:  Procedure Laterality Date  . WISDOM TOOTH EXTRACTION  2005   Medication List:  Current Outpatient Medications  Medication Sig Dispense Refill  . [employer] SUNSCREEN SPF 30 EX apply    . Fexofenadine HCl (ALLEGRA PO) Take 1 tablet by mouth daily.    . fluticasone (FLONASE) 50 MCG/ACT nasal spray Place 1 spray into both nostrils 2 (two) times daily as needed for allergies. 16 g 5   No current facility-administered medications for this visit.   Allergies: No Known Allergies Social History: Social History   Socioeconomic History  . Marital status: Married    Spouse name: Not on file  . Number of children: Not on file  . Years of education: Not on file  . Highest education level: Associate degree: academic program  Occupational History  . Not on file  Tobacco Use  . Smoking status: Never Smoker  . Smokeless tobacco: Never Used  Vaping Use  . Vaping Use: Never used  Substance and Sexual Activity  . Alcohol use: No  . Drug use: No  . Sexual activity: Yes  Other Topics Concern  . Not on file  Social History Narrative   She is currently [redacted] weeks pregnant with her first child (08/24/17).    Lives at home with her husband   Right handed   No caffeine   Social Determinants of Radio broadcast assistant Strain: Not on file  Food  Insecurity: Not on file  Transportation Needs: Not on file  Physical Activity: Not on file  Stress: Not on file  Social Connections: Not on file   Lives in a 34 year old house. Smoking: denies Occupation: Art gallery manager History: Water Damage/mildew in the house: no Carpet in the family room: yes Carpet in the bedroom: yes Heating: gas Cooling: central Pet: yes 1 cat x 14 yrs  Family History: Family History  Problem Relation Age of Onset  . Breast cancer Mother 69       triple negative  . Heart attack Mother   . Heart disease Mother   . Diabetes Mother   . Heart Problems Father 10  . Heart disease Father   . Diabetes Maternal Grandmother   . Ovarian cancer Maternal Grandmother   . Macular degeneration Maternal Grandmother   . Colon cancer Maternal Grandmother   . Kidney cancer Maternal Grandfather   . Bladder Cancer Maternal Grandfather   . Skin cancer Maternal Grandfather   . Diabetes Maternal Grandfather   . Breast cancer Cousin        dx late 28s, mat first cousin  . Macular degeneration Maternal Aunt    Problem                               Relation Asthma                                   Mother  Allergic rhino conjunctivitis     Mother, sisters  Review of Systems  Constitutional: Negative for appetite change, chills, fever and unexpected weight  change.  HENT: Positive for congestion, postnasal drip and rhinorrhea.   Eyes: Positive for itching.  Respiratory: Positive for cough. Negative for chest tightness, shortness of breath and wheezing.   Cardiovascular: Negative for chest pain.  Gastrointestinal: Negative for abdominal pain.  Genitourinary: Negative for difficulty urinating.  Skin: Negative for rash.  Allergic/Immunologic: Positive for environmental allergies.  Neurological: Positive for headaches.   Objective: BP 108/72 (BP Location: Left Arm, Patient Position: Sitting, Cuff Size: Normal)   Pulse 71   Temp 98.2 F (36.8 C)  (Temporal)   Resp 18   Ht 5\' 4"  (1.626 m)   Wt 138 lb 12.8 oz (63 kg)   SpO2 99%   BMI 23.82 kg/m  Body mass index is 23.82 kg/m. Physical Exam Vitals and nursing note reviewed.  Constitutional:      Appearance: Normal appearance. She is well-developed.  HENT:     Head: Normocephalic and atraumatic.     Right Ear: Tympanic membrane and external ear normal.     Left Ear: Tympanic membrane and external ear normal.     Nose: Nose normal.     Comments: Streaks of blood in left nares    Mouth/Throat:     Mouth: Mucous membranes are moist.     Pharynx: Oropharynx is clear.  Eyes:     Conjunctiva/sclera: Conjunctivae normal.  Cardiovascular:     Rate and Rhythm: Normal rate and regular rhythm.     Heart sounds: Normal heart sounds. No murmur heard. No friction rub. No gallop.   Pulmonary:     Effort: Pulmonary effort is normal.     Breath sounds: Normal breath sounds. No wheezing, rhonchi or rales.  Musculoskeletal:     Cervical back: Neck supple.  Skin:    General: Skin is warm.     Findings: No rash.  Neurological:     Mental Status: She is alert and oriented to person, place, and time.  Psychiatric:        Behavior: Behavior normal.    The plan was reviewed with the patient/family, and all questions/concerned were addressed.  It was my pleasure to see Kathryn Bradley today and participate in her care. Please feel free to contact me with any questions or concerns.  Sincerely,  Rexene Alberts, DO Allergy & Immunology  [facility] office: [place] office: [phone]

## 2020-09-23 NOTE — Assessment & Plan Note (Signed)
   See assessment and plan as above for allergic rhinitis.  

## 2020-09-23 NOTE — Assessment & Plan Note (Signed)
History of asthma as a child but never used her inhaler.  Sometimes has episodes of shortness of breath.  Today's spirometry was normal.  Declines albuterol Rx.  Monitor symptoms.

## 2020-11-27 ENCOUNTER — Ambulatory Visit: Payer: Commercial Managed Care - PPO | Admitting: Allergy

## 2021-01-23 ENCOUNTER — Ambulatory Visit: Payer: Commercial Managed Care - PPO | Admitting: Allergy & Immunology

## 2021-02-25 ENCOUNTER — Ambulatory Visit: Payer: Commercial Managed Care - PPO | Admitting: Allergy & Immunology

## 2021-02-25 ENCOUNTER — Encounter: Payer: Self-pay | Admitting: Allergy & Immunology

## 2021-02-25 ENCOUNTER — Other Ambulatory Visit: Payer: Self-pay

## 2021-02-25 VITALS — BP 110/76 | HR 80 | Temp 98.0°F | Resp 18 | Ht 63.0 in | Wt 142.6 lb

## 2021-02-25 DIAGNOSIS — J3089 Other allergic rhinitis: Secondary | ICD-10-CM

## 2021-02-25 MED ORDER — MONTELUKAST SODIUM 10 MG PO TABS: 10.0000 mg | ORAL_TABLET | Freq: Every day | ORAL | 5 refills | Status: DC

## 2021-02-25 MED ORDER — LEVOCETIRIZINE DIHYDROCHLORIDE 5 MG PO TABS: 5.0000 mg | ORAL_TABLET | Freq: Every evening | ORAL | 5 refills | Status: DC

## 2021-02-25 NOTE — Patient Instructions (Addendum)
Perennial allergic rhinitis (cat, mold) - Let's change to Xyzal instead of Allegra. - Add on Singulair (montelukast) 10mg .  - This can cause irritability and depression and weird dreams, so beware of that. - Consider nasal gel to help with moisturizing.  - Consider allergy shots.    2. Return in about 6 months (around 08/25/2021).    Please inform us of any Emergency Department visits, hospitalizations, or changes in symptoms. Call us before going to the ED for breathing or allergy symptoms since we might be able to fit you in for a sick visit. Feel free to contact us anytime with any questions, problems, or concerns.  It was a pleasure to meet you today!  Websites that have reliable patient information: 1. American Academy of Asthma, Allergy, and Immunology: www.aaaai.org 2. Food Allergy Research and Education (FARE): foodallergy.org 3. Mothers of Asthmatics: http://www.asthmacommunitynetwork.org 4. American College of Allergy, Asthma, and Immunology: www.acaai.org   COVID-19 Vaccine Information can be found at: ShippingScam.co.uk For questions related to vaccine distribution or appointments, please email [email or web address] or call [phone].   We realize that you might be concerned about having an allergic reaction to the COVID19 vaccines. To help with that concern, WE ARE OFFERING THE COVID19 VACCINES IN OUR OFFICE! Ask the front desk for dates!     "Like" [place] on Facebook and Instagram for our latest updates!      A healthy democracy works best when [employer] participate! Make sure you are registered to vote! If you have moved or changed any of your contact information, you will need to get this updated before voting!  In some cases, you MAY be able to register to vote online: [email or web address]

## 2021-02-25 NOTE — Progress Notes (Signed)
FOLLOW UP  Date of Service/Encounter:  02/25/21   Assessment:   Perennial allergic rhinitis (indoor molds, cat)  Plan/Recommendations:   Perennial allergic rhinitis (cat, mold) - Let's change to Xyzal instead of Allegra. - Add on Singulair (montelukast) 10mg .  - This can cause irritability and depression and weird dreams, so beware of that. - Consider nasal gel to help with moisturizing.  - Consider allergy shots.   2. Return in about 6 months (around 08/25/2021).      Subjective:   Kathryn Bradley is a 35 y.o. female presenting today for follow up of  Chief Complaint  Patient presents with   Allergic Rhinitis     No flares     Kathryn Bradley has a history of the following: Patient Active Problem List   Diagnosis Date Noted   Other allergic rhinitis 09/23/2020   History of asthma 09/23/2020   Lactose intolerance 09/23/2020   Allergic conjunctivitis of both eyes 09/23/2020   Genetic testing 06/20/2020   Family history of breast cancer    Family history of ovarian cancer    Family history of colon cancer    Labor and delivery, indication for care 02/28/2018    History obtained from: chart review and patient.  Kathryn Bradley is a 35 y.o. female presenting for a follow up visit. She was last seen in April 2022. At that time, she did skin testing with Dr. Maudie Mercury. She was positive only to indoor molds and cat. She was started on an OTC antihistamine as well as fluticasone. She was also started on nasal saline.   In the interim, she has mostly done well, although she does not feel much different than she did before she saw [place].  Allergic Rhinitis Symptom History: She is on Allegra and Flonase. This is working better than others, but she has been on allergy medication for years. She is allergic to cats that she has had for years. She does go into bedrooms. She has tried a slew of antihistamines. She has never been on montelukast. She has never had a prescription for allergy medications since  she was a kid until they were OTC.  She was positive to cat and mold.   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 She seems to like it.   Otherwise, there have been no changes to her past medical history, surgical history, family history, or social history.    Review of Systems  Constitutional: Negative.  Negative for chills, fever, malaise/fatigue and weight loss.  HENT:  Positive for congestion. Negative for ear discharge, ear pain and sinus pain.   Eyes:  Negative for pain, discharge and redness.  Respiratory:  Negative for cough, sputum production, shortness of breath and wheezing.   Cardiovascular: Negative.  Negative for chest pain and palpitations.  Gastrointestinal:  Negative for abdominal pain, constipation, diarrhea, heartburn, nausea and vomiting.  Skin: Negative.  Negative for itching and rash.  Neurological:  Negative for dizziness and headaches.  Endo/Heme/Allergies:  Positive for environmental allergies. Does not bruise/bleed easily.      Objective:   Blood pressure 110/76, pulse 80, temperature 98 F (36.7 C), resp. rate 18, height 5\' 3"  (1.6 m), weight 142 lb 9.6 oz (64.7 kg), SpO2 96 %, unknown if currently breastfeeding. Body mass index is 25.26 kg/m.   Physical Exam:  Physical Exam Vitals reviewed.  Constitutional:      Appearance: She is well-developed.  HENT:  Head: Normocephalic and atraumatic.     Right Ear: Tympanic membrane, ear canal and external ear normal.     Left Ear: Tympanic membrane, ear canal and external ear normal.     Nose: No nasal deformity, septal deviation, mucosal edema or rhinorrhea.     Right Turbinates: Enlarged, swollen and pale.     Left Turbinates: Enlarged, swollen and pale.     Right Sinus: No maxillary sinus tenderness or frontal sinus tenderness.     Left Sinus: No maxillary sinus tenderness or frontal sinus tenderness.     Mouth/Throat:     Mouth: Mucous membranes  are not pale and not dry.     Pharynx: Uvula midline.  Eyes:     General: Lids are normal. No allergic shiner.       Right eye: No discharge.        Left eye: No discharge.     Conjunctiva/sclera: Conjunctivae normal.     Right eye: Right conjunctiva is not injected. No chemosis.    Left eye: Left conjunctiva is not injected. No chemosis.    Pupils: Pupils are equal, round, and reactive to light.  Cardiovascular:     Rate and Rhythm: Normal rate and regular rhythm.     Heart sounds: Normal heart sounds.  Pulmonary:     Effort: Pulmonary effort is normal. No tachypnea, accessory muscle usage or respiratory distress.     Breath sounds: Normal breath sounds. No wheezing, rhonchi or rales.     Comments: Moving air well in all lung fields. No increased work of breathing noted.  Chest:     Chest wall: No tenderness.  Lymphadenopathy:     Cervical: No cervical adenopathy.  Skin:    Coloration: Skin is not pale.     Findings: No abrasion, erythema, petechiae or rash. Rash is not papular, urticarial or vesicular.  Neurological:     Mental Status: She is alert.  Psychiatric:        Behavior: Behavior is cooperative.     Diagnostic studies: none      Salvatore Marvel, MD  [facility]

## 2021-03-06 ENCOUNTER — Ambulatory Visit: Payer: Commercial Managed Care - PPO | Admitting: Hematology and Oncology

## 2021-03-06 ENCOUNTER — Telehealth: Payer: Self-pay | Admitting: Hematology and Oncology

## 2021-03-06 NOTE — Progress Notes (Deleted)
[facility] CONSULT NOTE  Patient Care Team: Scifres, Durel Salts as PCP - General (Physician Assistant)  CHIEF COMPLAINTS/PURPOSE OF CONSULTATION:  High risk breast cancer clinic.  ASSESSMENT & PLAN:  No problem-specific Assessment & Plan notes found for this encounter.  No orders of the defined types were placed in this encounter.  1.  Family history of breast cancer in mother and maternal cousin She had genetic testing and has no identifiable pathogenic mutations.  Her lifetime risk of breast cancer per [employer] model is around 27%.  Hence she was referred to high-risk breast clinic for further follow-up. She is quite healthy at baseline, denies any health complaints except for allergies. Physical examination today including breast exam quite unremarkable. We have confirmed that her lifetime risk of breast cancer based on TC more recently over 20% and hence we have discussed about imaging with MRIs and mammograms but since she is only 37 we have discussed about considering her first mammogram at age 38 and may be we can start serial mammograms at the age of 38 alternating with an MRI.  She is here for a follow up  Thank you for consulting [place] in the care of this patient.  Please not hesitate to contact us with any additional questions or concerns.  HISTORY OF PRESENTING ILLNESS:  Kathryn Bradley 35 y.o. female is here because of Life time risk of BC per TC model to be about 25%  This is a very pleasant 35 year old female patient who is very healthy referred to our high-risk breast cancer clinic given her family history of breast cancer.  Kathryn Bradley is here for an additional visit by herself.  She has significant family history of breast cancer in her mom who had bilateral breast cancer, breast cancer at the age of 30 in the setting of breast cancer at the age of 72 which was triple negative and she died from breast cancer at the age of 34.  Kathryn Bradley also has a maternal cousin  who had breast cancer in her mid 56s.  Maternal grandmother had ovarian cancer.  Kathryn Bradley has never had any breast biopsies or any known breast abnormalities.  She is healthy at baseline, has never had a mammogram but does self breast exam and denies any breast findings.  She denies any birth control usage.  Rest of the pertinent 10 point ROS reviewed and negative  Age of menarche: 62 Age at first pregnancy: 61 No birth control  REVIEW OF SYSTEMS:   Constitutional: Denies fevers, chills or abnormal night sweats Eyes: Denies blurriness of vision, double vision or watery eyes Ears, nose, mouth, throat, and face: Denies mucositis or sore throat Respiratory: Denies cough, dyspnea or wheezes Cardiovascular: Denies palpitation, chest discomfort or lower extremity swelling Gastrointestinal:  Denies nausea, heartburn or change in bowel habits Skin: Denies abnormal skin rashes Lymphatics: Denies new lymphadenopathy or easy bruising Neurological:Denies numbness, tingling or new weaknesses Behavioral/Psych: Mood is stable, no new changes  All other systems were reviewed with the patient and are negative.  MEDICAL HISTORY:  Past Medical History:  Diagnosis Date   Abdominal pain    Allergic rhinitis    Anxiety    Asthma    Depression    Esophageal reflux    Family history of breast cancer    Family history of colon cancer    Family history of ovarian cancer    Lactose intolerance    Migraine headache     SURGICAL HISTORY: Past Surgical  History:  Procedure Laterality Date   WISDOM TOOTH EXTRACTION  2005    SOCIAL HISTORY: Social History   Socioeconomic History   Marital status: Married    Spouse name: Not on file   Number of children: Not on file   Years of education: Not on file   Highest education level: Associate degree: academic program  Occupational History   Not on file  Tobacco Use   Smoking status: Never Smoker   Smokeless tobacco: Never Used  Vaping Use    Vaping Use: Never used  Substance and Sexual Activity   Alcohol use: No   Drug use: No   Sexual activity: Yes  Other Topics Concern   Not on file  Social History Narrative      Lives at home with her husband   Right handed   No caffeine   Social Determinants of Radio broadcast assistant Strain: Not on file  Food Insecurity: Not on file  Transportation Needs: Not on file  Physical Activity: Not on file  Stress: Not on file  Social Connections: Not on file  Intimate Partner Violence: Not on file    FAMILY HISTORY: Family History  Problem Relation Age of Onset   Breast cancer Mother 70       triple negative   Heart attack Mother    Heart disease Mother    Diabetes Mother    Heart Problems Father 66   Heart disease Father    Diabetes Maternal Grandmother    Ovarian cancer Maternal Grandmother    Macular degeneration Maternal Grandmother    Colon cancer Maternal Grandmother    Kidney cancer Maternal Grandfather    Bladder Cancer Maternal Grandfather    Skin cancer Maternal Grandfather    Diabetes Maternal Grandfather    Breast cancer Cousin        dx late 70s, mat first cousin   Macular degeneration Maternal Aunt     ALLERGIES:  has No Known Allergies.  MEDICATIONS:  Current Outpatient Medications  Medication Sig Dispense Refill   [employer] SUNSCREEN SPF 30 EX apply     Fexofenadine HCl (ALLEGRA PO) Take 1 tablet by mouth daily.     fluticasone (FLONASE) 50 MCG/ACT nasal spray Place 1 spray into both nostrils 2 (two) times daily as needed for allergies. 16 g 5   levocetirizine (XYZAL) 5 MG tablet Take 1 tablet (5 mg total) by mouth every evening. 30 tablet 5   montelukast (SINGULAIR) 10 MG tablet Take 1 tablet (10 mg total) by mouth at bedtime. 30 tablet 5   No current facility-administered medications for this visit.     PHYSICAL EXAMINATION:  ECOG PERFORMANCE STATUS: 0 - Asymptomatic  There were no vitals filed for this visit.  There were no vitals filed  for this visit.   GENERAL:alert, no distress and comfortable SKIN: skin color, texture, turgor are normal, no rashes or significant lesions EYES: normal, conjunctiva are pink and non-injected, sclera clear OROPHARYNX:no exudate, no erythema and lips, buccal mucosa, and tongue normal  NECK: supple, thyroid normal size, non-tender, without nodularity LYMPH:  no palpable lymphadenopathy in the cervical, axillary or inguinal LUNGS: clear to auscultation and percussion with normal breathing effort HEART: regular rate & rhythm and no murmurs and no lower extremity edema ABDOMEN:abdomen soft, non-tender and normal bowel sounds Musculoskeletal:no cyanosis of digits and no clubbing  PSYCH: alert & oriented x 3 with fluent speech NEURO: no focal motor/sensory deficits Breast exam: Bilateral breasts inspected and palpated.  No palpable masses or regional adenopathy.  LABORATORY DATA:  I have reviewed the data as listed Lab Results  Component Value Date   WBC 13.3 (H) 03/01/2018   HGB 10.0 (L) 03/01/2018   HCT 29.4 (L) 03/01/2018   MCV 91.3 03/01/2018   PLT 174 03/01/2018     Chemistry   No results found for: NA, K, CL, CO2, BUN, CREATININE, GLU No results found for: CALCIUM, ALKPHOS, AST, ALT, BILITOT    Lifetime risk of breast cancer per [employer] model based on personal history and family history is about 27%.   RADIOGRAPHIC STUDIES: I have personally reviewed the radiological images as listed and agreed with the findings in the report. No results found.  All questions were answered. The patient knows to call the clinic with any problems, questions or concerns. I spent 45 minutes in the care of this patient including H and P, review of records, counseling and coordination of care.     Benay Pike, MD 03/06/2021 9:11 AM

## 2021-03-06 NOTE — Telephone Encounter (Signed)
R/s appt per patient request in sch msg. Called and left msg.

## 2021-03-12 ENCOUNTER — Telehealth: Payer: Self-pay | Admitting: Hematology and Oncology

## 2021-03-12 ENCOUNTER — Inpatient Hospital Stay: Payer: Commercial Managed Care - PPO | Admitting: Hematology and Oncology

## 2021-03-12 NOTE — Telephone Encounter (Signed)
Rescheduled per 10/4 sch msg, pt has been called and confirmed appt

## 2021-03-12 NOTE — Progress Notes (Deleted)
[facility] CONSULT NOTE  Patient Care Team: Kathryn Bradley as PCP - General (Physician Assistant)  CHIEF COMPLAINTS/PURPOSE OF CONSULTATION:  High risk breast cancer clinic.  ASSESSMENT & PLAN:  No problem-specific Assessment & Plan notes found for this encounter.  No orders of the defined types were placed in this encounter.  1.  Family history of breast cancer in mother and maternal cousin She had genetic testing and has no identifiable pathogenic mutations.  Her lifetime risk of breast cancer per [employer] model is around 27%.  Hence she was referred to high-risk breast clinic for further follow-up. She is quite healthy at baseline, denies any health complaints except for allergies. Physical examination today including breast exam quite unremarkable. We have confirmed that her lifetime risk of breast cancer based on TC more recently over 20% and hence we have discussed about imaging with MRIs and mammograms but since she is only 24 we have discussed about considering her first mammogram at age 70 and may be we can start serial mammograms at the age of 65 alternating with an MRI. We have discussed that the long-term risks of gadolinium deposition from subsequent MRIs is not clear.  She understands that an MRI can be very sensitive and can sometimes lead to unnecessary biopsies and false positive results. She is agreeable for follow-up with high-risk breast cancer clinic.  She will return to clinic in about 6 months. We have also discussed about lifestyle modifications including maintaining a healthy diet, over body mass index, minimizing alcohol intake and regular exercise.  Thank you for consulting [place] in the care of this patient.  Please not hesitate to contact us with any additional questions or concerns.  HISTORY OF PRESENTING ILLNESS:  Kathryn Bradley 35 y.o. female is here because of Life time risk of BC per TC model to be about 25%  This is a very pleasant  34 year old female patient who is very healthy referred to our high-risk breast cancer clinic given her family history of breast cancer.  Kathryn Bradley is here for an additional visit by herself.  She has significant family history of breast cancer in her mom who had bilateral breast cancer, breast cancer at the age of 55 in the setting of breast cancer at the age of 67 which was triple negative and she died from breast cancer at the age of 34.  Kathryn Bradley also has a maternal cousin who had breast cancer in her mid 20s.  Maternal grandmother had ovarian cancer.  Ms./personally has never had any breast biopsies or any known breast abnormalities.  She is healthy at baseline, has never had a mammogram but does self breast exam and denies any breast findings.  She denies any birth control usage.  Rest of the pertinent 10 point ROS reviewed and negative  Age of menarche: 63 Age at first pregnancy: 75 No birth control  REVIEW OF SYSTEMS:   Constitutional: Denies fevers, chills or abnormal night sweats Eyes: Denies blurriness of vision, double vision or watery eyes Ears, nose, mouth, throat, and face: Denies mucositis or sore throat Respiratory: Denies cough, dyspnea or wheezes Cardiovascular: Denies palpitation, chest discomfort or lower extremity swelling Gastrointestinal:  Denies nausea, heartburn or change in bowel habits Skin: Denies abnormal skin rashes Lymphatics: Denies new lymphadenopathy or easy bruising Neurological:Denies numbness, tingling or new weaknesses Behavioral/Psych: Mood is stable, no new changes  All other systems were reviewed with the patient and are negative.  MEDICAL HISTORY:  Past Medical History:  Diagnosis Date   Abdominal pain    Allergic rhinitis    Anxiety    Asthma    Depression    Esophageal reflux    Family history of breast cancer    Family history of colon cancer    Family history of ovarian cancer    Lactose intolerance    Migraine headache     SURGICAL  HISTORY: Past Surgical History:  Procedure Laterality Date   WISDOM TOOTH EXTRACTION  2005    SOCIAL HISTORY: Social History   Socioeconomic History   Marital status: Married    Spouse name: Not on file   Number of children: Not on file   Years of education: Not on file   Highest education level: Associate degree: academic program  Occupational History   Not on file  Tobacco Use   Smoking status: Never Smoker   Smokeless tobacco: Never Used  Vaping Use   Vaping Use: Never used  Substance and Sexual Activity   Alcohol use: No   Drug use: No   Sexual activity: Yes  Other Topics Concern   Not on file  Social History Narrative      Lives at home with her husband   Right handed   No caffeine   Social Determinants of Radio broadcast assistant Strain: Not on file  Food Insecurity: Not on file  Transportation Needs: Not on file  Physical Activity: Not on file  Stress: Not on file  Social Connections: Not on file  Intimate Partner Violence: Not on file    FAMILY HISTORY: Family History  Problem Relation Age of Onset   Breast cancer Mother 49       triple negative   Heart attack Mother    Heart disease Mother    Diabetes Mother    Heart Problems Father 29   Heart disease Father    Diabetes Maternal Grandmother    Ovarian cancer Maternal Grandmother    Macular degeneration Maternal Grandmother    Colon cancer Maternal Grandmother    Kidney cancer Maternal Grandfather    Bladder Cancer Maternal Grandfather    Skin cancer Maternal Grandfather    Diabetes Maternal Grandfather    Breast cancer Cousin        dx late 4s, mat first cousin   Macular degeneration Maternal Aunt     ALLERGIES:  has No Known Allergies.  MEDICATIONS:  Current Outpatient Medications  Medication Sig Dispense Refill   [employer] SUNSCREEN SPF 30 EX apply     Fexofenadine HCl (ALLEGRA PO) Take 1 tablet by mouth daily.     fluticasone (FLONASE) 50 MCG/ACT nasal spray Place 1 spray into both  nostrils 2 (two) times daily as needed for allergies. 16 g 5   levocetirizine (XYZAL) 5 MG tablet Take 1 tablet (5 mg total) by mouth every evening. 30 tablet 5   montelukast (SINGULAIR) 10 MG tablet Take 1 tablet (10 mg total) by mouth at bedtime. 30 tablet 5   No current facility-administered medications for this visit.     PHYSICAL EXAMINATION:  ECOG PERFORMANCE STATUS: 0 - Asymptomatic  There were no vitals filed for this visit.  There were no vitals filed for this visit.   GENERAL:alert, no distress and comfortable SKIN: skin color, texture, turgor are normal, no rashes or significant lesions EYES: normal, conjunctiva are pink and non-injected, sclera clear OROPHARYNX:no exudate, no erythema and lips, buccal mucosa, and tongue normal  NECK: supple, thyroid normal size, non-tender, without nodularity LYMPH:  no palpable lymphadenopathy in the cervical, axillary or inguinal LUNGS: clear to auscultation and percussion with normal breathing effort HEART: regular rate & rhythm and no murmurs and no lower extremity edema ABDOMEN:abdomen soft, non-tender and normal bowel sounds Musculoskeletal:no cyanosis of digits and no clubbing  PSYCH: alert & oriented x 3 with fluent speech NEURO: no focal motor/sensory deficits Breast exam: Bilateral breasts inspected and palpated.  No palpable masses or regional adenopathy.  LABORATORY DATA:  I have reviewed the data as listed Lab Results  Component Value Date   WBC 13.3 (H) 03/01/2018   HGB 10.0 (L) 03/01/2018   HCT 29.4 (L) 03/01/2018   MCV 91.3 03/01/2018   PLT 174 03/01/2018     Chemistry   No results found for: NA, K, CL, CO2, BUN, CREATININE, GLU No results found for: CALCIUM, ALKPHOS, AST, ALT, BILITOT    Lifetime risk of breast cancer per [employer] model based on personal history and family history is about 27%.   RADIOGRAPHIC STUDIES: I have personally reviewed the radiological images as listed and agreed with the  findings in the report. No results found.  All questions were answered. The patient knows to call the clinic with any problems, questions or concerns. I spent 45 minutes in the care of this patient including H and P, review of records, counseling and coordination of care.     Benay Pike, MD 03/12/2021 1:07 PM

## 2021-03-19 ENCOUNTER — Inpatient Hospital Stay: Payer: Commercial Managed Care - PPO | Attending: Hematology and Oncology | Admitting: Hematology and Oncology

## 2021-03-19 ENCOUNTER — Encounter: Payer: Self-pay | Admitting: Hematology and Oncology

## 2021-03-19 ENCOUNTER — Other Ambulatory Visit: Payer: Self-pay

## 2021-03-19 VITALS — BP 100/77 | HR 77 | Temp 97.6°F | Resp 18 | Wt 142.4 lb

## 2021-03-19 DIAGNOSIS — Z803 Family history of malignant neoplasm of breast: Secondary | ICD-10-CM

## 2021-03-19 DIAGNOSIS — Z9189 Other specified personal risk factors, not elsewhere classified: Secondary | ICD-10-CM | POA: Diagnosis not present

## 2021-03-19 DIAGNOSIS — Z7689 Persons encountering health services in other specified circumstances: Secondary | ICD-10-CM | POA: Insufficient documentation

## 2021-03-19 DIAGNOSIS — Z8041 Family history of malignant neoplasm of ovary: Secondary | ICD-10-CM | POA: Insufficient documentation

## 2021-03-19 DIAGNOSIS — J45909 Unspecified asthma, uncomplicated: Secondary | ICD-10-CM | POA: Diagnosis not present

## 2021-03-19 DIAGNOSIS — K219 Gastro-esophageal reflux disease without esophagitis: Secondary | ICD-10-CM | POA: Insufficient documentation

## 2021-03-19 NOTE — Progress Notes (Signed)
[facility] CONSULT NOTE  Patient Care Team: Scifres, Durel Salts as PCP - General (Physician Assistant)  CHIEF COMPLAINTS/PURPOSE OF CONSULTATION:  High risk breast cancer clinic.  ASSESSMENT & PLAN:   1.  Family history of breast cancer in mother and maternal cousin She had genetic testing and has no identifiable pathogenic mutations.  Her lifetime risk of breast cancer per [employer] model is around 27%.  Hence she was referred to high-risk breast clinic for further follow-up. We have confirmed that her lifetime risk of breast cancer based on TC score to be over 20% and hence we have discussed about imaging with MRIs and mammograms but since she is only 55 we have discussed about considering her first mammogram at age 46 and may be we can start serial mammograms at the age of 13 alternating with an MRI. We have discussed that the long-term risks of gadolinium deposition from subsequent MRIs is not clear.  She understands that an MRI can be very sensitive and can sometimes lead to unnecessary biopsies and false positive results. She is agreeable for follow-up with high-risk breast cancer clinic.  She will return to clinic in about 6 months. We have also discussed about lifestyle modifications including maintaining a healthy diet, over body mass index, minimizing alcohol intake and regular exercise.  Anticipate first mammogram at 58. No concerning findings on breast exam, no regional pathologic LN. SBE recommended monthly.  She was encouraged to reach out to [place] with any new questions or concerns. Thank you for consulting [place] in the care of this patient.  Please not hesitate to contact us with any additional questions or concerns.  HISTORY OF PRESENTING ILLNESS:   Kathryn Bradley 35 y.o. female is here because of Life time risk of BC per TC model to be about 25%  This is a very pleasant 35 year old female patient who is very healthy referred to our high-risk breast cancer clinic  given her family history of breast cancer. She has significant family history of breast cancer in her mom who had bilateral breast cancer, breast cancer at the age of 35 in the setting of breast cancer at the age of 14 which was triple negative and she died from breast cancer at the age of 67.  Kathryn Bradley also has a maternal cousin who had breast cancer in her mid 50s.  Maternal grandmother had ovarian cancer.  Ms./personally has never had any breast biopsies or any known breast abnormalities.  She is healthy at baseline, has never had a mammogram but does self breast exam and denies any breast findings.  She denies any birth control usage.    Age of menarche: 30 Age at first pregnancy: 15 No birth control  Interval history Kathryn Bradley is here for follow-up by herself.  Since her last visit, she has been doing quite well.  No interim health issues.  She has been diligent about breast exam and does not report any changes.  No other new medications or hospitalizations.  She is not using any birth control at this time.  Rest of the pertinent 10 point ROS reviewed and negative.  REVIEW OF SYSTEMS:   Constitutional: Denies fevers, chills or abnormal night sweats Eyes: Denies blurriness of vision, double vision or watery eyes Ears, nose, mouth, throat, and face: Denies mucositis or sore throat Respiratory: Denies cough, dyspnea or wheezes Cardiovascular: Denies palpitation, chest discomfort or lower extremity swelling Gastrointestinal:  Denies nausea, heartburn or change in bowel habits Skin: Denies abnormal  skin rashes Lymphatics: Denies new lymphadenopathy or easy bruising Neurological:Denies numbness, tingling or new weaknesses Behavioral/Psych: Mood is stable, no new changes  All other systems were reviewed with the patient and are negative.  MEDICAL HISTORY:  Past Medical History:  Diagnosis Date   Abdominal pain    Allergic rhinitis    Anxiety    Asthma    Depression    Esophageal reflux     Family history of breast cancer    Family history of colon cancer    Family history of ovarian cancer    Lactose intolerance    Migraine headache     SURGICAL HISTORY: Past Surgical History:  Procedure Laterality Date   WISDOM TOOTH EXTRACTION  2005    SOCIAL HISTORY: Social History   Socioeconomic History   Marital status: Married    Spouse name: Not on file   Number of children: Not on file   Years of education: Not on file   Highest education level: Associate degree: academic program  Occupational History   Not on file  Tobacco Use   Smoking status: Never Smoker   Smokeless tobacco: Never Used  Vaping Use   Vaping Use: Never used  Substance and Sexual Activity   Alcohol use: No   Drug use: No   Sexual activity: Yes  Other Topics Concern   Not on file  Social History Narrative      Lives at home with her husband   Right handed   No caffeine   Social Determinants of Radio broadcast assistant Strain: Not on file  Food Insecurity: Not on file  Transportation Needs: Not on file  Physical Activity: Not on file  Stress: Not on file  Social Connections: Not on file  Intimate Partner Violence: Not on file    FAMILY HISTORY: Family History  Problem Relation Age of Onset   Breast cancer Mother 45       triple negative   Heart attack Mother    Heart disease Mother    Diabetes Mother    Heart Problems Father 98   Heart disease Father    Diabetes Maternal Grandmother    Ovarian cancer Maternal Grandmother    Macular degeneration Maternal Grandmother    Colon cancer Maternal Grandmother    Kidney cancer Maternal Grandfather    Bladder Cancer Maternal Grandfather    Skin cancer Maternal Grandfather    Diabetes Maternal Grandfather    Breast cancer Cousin        dx late 11s, mat first cousin   Macular degeneration Maternal Aunt     ALLERGIES:  has No Known Allergies.  MEDICATIONS:  Current Outpatient Medications  Medication Sig Dispense Refill   [employer]  SUNSCREEN SPF 30 EX apply     Fexofenadine HCl (ALLEGRA PO) Take 1 tablet by mouth daily.     fluticasone (FLONASE) 50 MCG/ACT nasal spray Place 1 spray into both nostrils 2 (two) times daily as needed for allergies. 16 g 5   levocetirizine (XYZAL) 5 MG tablet Take 1 tablet (5 mg total) by mouth every evening. 30 tablet 5   montelukast (SINGULAIR) 10 MG tablet Take 1 tablet (10 mg total) by mouth at bedtime. 30 tablet 5   No current facility-administered medications for this visit.     PHYSICAL EXAMINATION:  ECOG PERFORMANCE STATUS: 0 - Asymptomatic  Vitals:   03/19/21 1116  BP: 100/77  Pulse: 77  Resp: 18  Temp: 97.6 F (36.4 C)  SpO2: 100%  Filed Weights   03/19/21 1116  Weight: 142 lb 6 oz (64.6 kg)     GENERAL:alert, no distress and comfortable SKIN: skin color, texture, turgor are normal, no rashes or significant lesions EYES: normal, conjunctiva are pink and non-injected, sclera clear OROPHARYNX:no exudate, no erythema and lips, buccal mucosa, and tongue normal  NECK: supple, thyroid normal size, non-tender, without nodularity LYMPH:  no palpable lymphadenopathy in the cervical, axillary or inguinal LUNGS: clear to auscultation and percussion with normal breathing effort HEART: regular rate & rhythm and no murmurs and no lower extremity edema ABDOMEN:abdomen soft, non-tender and normal bowel sounds Musculoskeletal:no cyanosis of digits and no clubbing  PSYCH: alert & oriented x 3 with fluent speech NEURO: no focal motor/sensory deficits Breast exam: Bilateral breasts inspected and palpated.  No palpable masses or regional adenopathy.  LABORATORY DATA:  I have reviewed the data as listed Lab Results  Component Value Date   WBC 13.3 (H) 03/01/2018   HGB 10.0 (L) 03/01/2018   HCT 29.4 (L) 03/01/2018   MCV 91.3 03/01/2018   PLT 174 03/01/2018     Chemistry   No results found for: NA, K, CL, CO2, BUN, CREATININE, GLU No results found for: CALCIUM, ALKPHOS,  AST, ALT, BILITOT    Lifetime risk of breast cancer per [employer] model based on personal history and family history is about 27%.   RADIOGRAPHIC STUDIES: I have personally reviewed the radiological images as listed and agreed with the findings in the report. No results found.  All questions were answered. The patient knows to call the clinic with any problems, questions or concerns. I spent 20 minutes in the care of this patient including H and P, review of records, counseling and coordination of care.     Benay Pike, MD 03/19/2021 1:19 PM

## 2021-08-28 ENCOUNTER — Ambulatory Visit: Payer: Commercial Managed Care - PPO | Admitting: Allergy & Immunology

## 2021-09-16 ENCOUNTER — Telehealth: Payer: Self-pay | Admitting: Hematology and Oncology

## 2021-09-16 NOTE — Telephone Encounter (Signed)
Patient called to reschedule 04/12 appointment to 04/28 due to a sore throat. Appointments have been moved. ?

## 2021-09-17 ENCOUNTER — Inpatient Hospital Stay: Payer: Commercial Managed Care - PPO | Admitting: Hematology and Oncology

## 2021-10-03 ENCOUNTER — Other Ambulatory Visit: Payer: Self-pay | Admitting: Physician Assistant

## 2021-10-03 ENCOUNTER — Inpatient Hospital Stay: Payer: 59 | Attending: Hematology and Oncology | Admitting: Hematology and Oncology

## 2021-10-03 ENCOUNTER — Other Ambulatory Visit: Payer: Self-pay

## 2021-10-03 ENCOUNTER — Inpatient Hospital Stay: Payer: 59

## 2021-10-03 ENCOUNTER — Encounter: Payer: Self-pay | Admitting: Hematology and Oncology

## 2021-10-03 VITALS — BP 119/59 | HR 80 | Temp 99.7°F | Wt 138.0 lb

## 2021-10-03 DIAGNOSIS — Z8041 Family history of malignant neoplasm of ovary: Secondary | ICD-10-CM | POA: Diagnosis not present

## 2021-10-03 DIAGNOSIS — Z9189 Other specified personal risk factors, not elsewhere classified: Secondary | ICD-10-CM | POA: Diagnosis not present

## 2021-10-03 DIAGNOSIS — Z800 Family history of malignant neoplasm of digestive organs: Secondary | ICD-10-CM | POA: Diagnosis not present

## 2021-10-03 DIAGNOSIS — Z8051 Family history of malignant neoplasm of kidney: Secondary | ICD-10-CM | POA: Insufficient documentation

## 2021-10-03 DIAGNOSIS — Z1501 Genetic susceptibility to malignant neoplasm of breast: Secondary | ICD-10-CM | POA: Insufficient documentation

## 2021-10-03 DIAGNOSIS — Z803 Family history of malignant neoplasm of breast: Secondary | ICD-10-CM | POA: Diagnosis not present

## 2021-10-03 DIAGNOSIS — Z8052 Family history of malignant neoplasm of bladder: Secondary | ICD-10-CM | POA: Diagnosis not present

## 2021-10-03 DIAGNOSIS — R5383 Other fatigue: Secondary | ICD-10-CM

## 2021-10-03 DIAGNOSIS — R42 Dizziness and giddiness: Secondary | ICD-10-CM

## 2021-10-03 LAB — HCG, SERUM, QUALITATIVE: Preg, Serum: NEGATIVE

## 2021-10-03 NOTE — Progress Notes (Signed)
[facility] ?CONSULT NOTE ? ?Patient Care Team: ?Scifres, Durel Salts as PCP - General (Physician Assistant) ? ?CHIEF COMPLAINTS/PURPOSE OF CONSULTATION:  ?High risk breast cancer clinic. ? ?ASSESSMENT & PLAN:  ? ?1.  Family history of breast cancer in mother and maternal cousin ?Her mom had breast cancer in her 10s and her maternal first cousin had breast cancer at the age of 38.  Hence we have agreed on not proceeding with mammogram and MRI.  Patient also complained of some nipple discharge on the right side.  No palpable retroareolar masses or discharge today.  I have ordered a mammogram and MRI for further investigation.  She will return to clinic in 4 weeks to review the results and if there is no suspicion for malignancy, she can return to clinic every 6 months. ?She otherwise has a healthy lifestyle, no new health concerns except for vertigo ?SBE recommended monthly.  ?She was encouraged to reach out to [place] with any new questions or concerns. ?Thank you for consulting [place] in the care of this patient.  Please not hesitate to contact us with any additional questions or concerns. ? ?HISTORY OF PRESENTING ILLNESS:  ? ?Kathryn Bradley 36 y.o. female is here because of Life time risk of BC per TC model to be about 25% ? ?This is a very pleasant 36 year old female patient who is very healthy referred to our high-risk breast cancer clinic given her family history of breast cancer. She has significant family history of breast cancer in her mom who had bilateral breast cancer, breast cancer at the age of 42 in the setting of breast cancer at the age of 15 which was triple negative and she died from breast cancer at the age of 36.  Kathryn Bradley also has a maternal cousin who had breast cancer in her mid 76s.  Maternal grandmother had ovarian cancer.  Ms./personally has never had any breast biopsies or any known breast abnormalities.  She is healthy at baseline, has never had a mammogram but does self breast exam  and denies any breast findings.  She denies any birth control usage.   ? ?Age of menarche: 33 ?Age at first pregnancy: 23 ?No birth control ? ?Interval history ?Kathryn Bradley is here for follow-up by herself.  She has been dealing with some vertigo-like symptoms since her last visit, has an ENT appointment coming up in the next month or so.  Besides that, she has also noticed some nipple discharge on the right side, she cannot quite tell any more details about it.  She has not breast-fed her child.  She her child is 30 years old.  She is not pregnant she says, just finished her menstrual cycle. ?No new palpable masses.  Rest of the pertinent 10 point ROS reviewed and negative ? ?MEDICAL HISTORY:  ?Past Medical History:  ?Diagnosis Date  ? Abdominal pain   ? Allergic rhinitis   ? Anxiety   ? Asthma   ? Depression   ? Esophageal reflux   ? Family history of breast cancer   ? Family history of colon cancer   ? Family history of ovarian cancer   ? Lactose intolerance   ? Migraine headache   ? ? ?SURGICAL HISTORY: ?Past Surgical History:  ?Procedure Laterality Date  ? [place] EXTRACTION  2005  ? ? ?SOCIAL HISTORY: ?Social History  ? ?Socioeconomic History  ? Marital status: Married  ?  Spouse name: Not on file  ? Number of children: Not  on file  ? Years of education: Not on file  ? Highest education level: Associate degree: academic program  ?Occupational History  ? Not on file  ?Tobacco Use  ? Smoking status: Never Smoker  ? Smokeless tobacco: Never Used  ?Vaping Use  ? Vaping Use: Never used  ?Substance and Sexual Activity  ? Alcohol use: No  ? Drug use: No  ? Sexual activity: Yes  ?Other Topics Concern  ? Not on file  ?Social History Narrative  ?   ? Lives at home with her husband  ? Right handed  ? No caffeine  ? ?Social Determinants of Health  ? ?Financial Resource Strain: Not on file  ?Food Insecurity: Not on file  ?Transportation Needs: Not on file  ?Physical Activity: Not on file  ?Stress: Not on file  ?Social  Connections: Not on file  ?Intimate Partner Violence: Not on file  ? ? ?FAMILY HISTORY: ?Family History  ?Problem Relation Age of Onset  ? Breast cancer Mother 81  ?     triple negative  ? Heart attack Mother   ? Heart disease Mother   ? Diabetes Mother   ? Heart Problems Father 11  ? Heart disease Father   ? Diabetes Maternal Grandmother   ? Ovarian cancer Maternal Grandmother   ? Macular degeneration Maternal Grandmother   ? Colon cancer Maternal Grandmother   ? Kidney cancer Maternal Grandfather   ? Bladder Cancer Maternal Grandfather   ? Skin cancer Maternal Grandfather   ? Diabetes Maternal Grandfather   ? Breast cancer Cousin   ?     dx late 22s, mat first cousin  ? Macular degeneration Maternal Aunt   ? ? ?ALLERGIES:  has No Known Allergies. ? ?MEDICATIONS:  ?Current Outpatient Medications  ?Medication Sig Dispense Refill  ? [employer] SUNSCREEN SPF 30 EX apply    ? Fexofenadine HCl (ALLEGRA PO) Take 1 tablet by mouth daily.    ? fluticasone (FLONASE) 50 MCG/ACT nasal spray Place 1 spray into both nostrils 2 (two) times daily as needed for allergies. 16 g 5  ? levocetirizine (XYZAL) 5 MG tablet Take 1 tablet (5 mg total) by mouth every evening. 30 tablet 5  ? montelukast (SINGULAIR) 10 MG tablet Take 1 tablet (10 mg total) by mouth at bedtime. 30 tablet 5  ? ?No current facility-administered medications for this visit.  ? ? ? ?PHYSICAL EXAMINATION: ? ?ECOG PERFORMANCE STATUS: 0 - Asymptomatic ? ?Vitals:  ? 10/03/21 0925  ?BP: (!) 119/59  ?Pulse: 80  ?Temp: 99.7 ?F (37.6 ?C)  ?SpO2: 100%  ? ? ? ?Filed Weights  ? 10/03/21 0925  ?Weight: 138 lb (62.6 kg)  ? ? ?GENERAL:alert, no distress and comfortable ?SKIN: skin color, texture, turgor are normal, no rashes or significant lesions ?EYES: normal, conjunctiva are pink and non-injected, sclera clear ?OROPHARYNX:no exudate, no erythema and lips, buccal mucosa, and tongue normal  ?NECK: supple, thyroid normal size, non-tender, without nodularity ?LYMPH:  no palpable  lymphadenopathy in the cervical, axillary or inguinal ?LUNGS: clear to auscultation and percussion with normal breathing effort ?HEART: regular rate & rhythm and no murmurs and no lower extremity edema ?ABDOMEN:abdomen soft, non-tender and normal bowel sounds ?Musculoskeletal:no cyanosis of digits and no clubbing  ?PSYCH: alert & oriented x 3 with fluent speech ?NEURO: no focal motor/sensory deficits ?Breast exam: Bilateral breasts inspected and palpated.  No palpable masses or regional adenopathy.  I could not elicit nipple discharge today.  No retroareolar masses.  Bilateral nipples  with some crusting, no abnormalities noted ? ?LABORATORY DATA:  ?I have reviewed the data as listed ?Lab Results  ?Component Value Date  ? WBC 13.3 (H) 03/01/2018  ? HGB 10.0 (L) 03/01/2018  ? HCT 29.4 (L) 03/01/2018  ? MCV 91.3 03/01/2018  ? PLT 174 03/01/2018  ? ?  Chemistry   ?No results found for: NA, K, CL, CO2, BUN, CREATININE, GLU No results found for: CALCIUM, ALKPHOS, AST, ALT, BILITOT  ? ? ?Lifetime risk of breast cancer per [employer] model based on personal history and family history is about 27%. ? ? ?RADIOGRAPHIC STUDIES: ?I have personally reviewed the radiological images as listed and agreed with the findings in the report. ?No results found. ? ?All questions were answered. The patient knows to call the clinic with any problems, questions or concerns. ?I spent 30 minutes in the care of this patient including H and P, review of records, counseling and coordination of care. ? ?  ? Benay Pike, MD ?10/03/2021 10:35 AM ? ? ? ?

## 2021-11-05 ENCOUNTER — Other Ambulatory Visit: Payer: Self-pay | Admitting: Hematology and Oncology

## 2021-11-05 DIAGNOSIS — Z9189 Other specified personal risk factors, not elsewhere classified: Secondary | ICD-10-CM

## 2021-11-06 ENCOUNTER — Inpatient Hospital Stay: Payer: 59 | Admitting: Hematology and Oncology

## 2021-11-10 ENCOUNTER — Ambulatory Visit
Admission: RE | Admit: 2021-11-10 | Discharge: 2021-11-10 | Disposition: A | Discharge status: Home / Self Care | Payer: 59 | Source: Ambulatory Visit | Attending: Hematology and Oncology | Admitting: Hematology and Oncology

## 2021-11-10 ENCOUNTER — Ambulatory Visit: Payer: 59

## 2021-11-10 ENCOUNTER — Encounter: Payer: Self-pay | Admitting: Hematology and Oncology

## 2021-11-10 ENCOUNTER — Other Ambulatory Visit: Payer: Self-pay | Admitting: Hematology and Oncology

## 2021-11-10 DIAGNOSIS — Z9189 Other specified personal risk factors, not elsewhere classified: Secondary | ICD-10-CM

## 2021-11-10 DIAGNOSIS — Z1231 Encounter for screening mammogram for malignant neoplasm of breast: Secondary | ICD-10-CM

## 2021-11-11 ENCOUNTER — Other Ambulatory Visit: Payer: Self-pay | Admitting: Hematology and Oncology

## 2021-11-11 ENCOUNTER — Telehealth: Payer: Self-pay | Admitting: *Deleted

## 2021-11-11 DIAGNOSIS — R928 Other abnormal and inconclusive findings on diagnostic imaging of breast: Secondary | ICD-10-CM

## 2021-11-11 NOTE — Telephone Encounter (Signed)
This RN spoke with pt per her My Chart message regarding concerns with baseline screening mammogram results with request for MRI due to noted areas of concern.  This RN reviewed results and discussed reading is not unusual for first time mammo in female of her age- MRI will give [place] the details and become her baseline.  Kathryn Bradley verbalized understanding and appreciation of review and discussion.  No further needs at this time

## 2021-11-17 ENCOUNTER — Ambulatory Visit
Admission: RE | Admit: 2021-11-17 | Discharge: 2021-11-17 | Disposition: A | Discharge status: Home / Self Care | Payer: 59 | Source: Ambulatory Visit | Attending: Hematology and Oncology | Admitting: Hematology and Oncology

## 2021-11-17 ENCOUNTER — Other Ambulatory Visit: Payer: Self-pay | Admitting: Hematology and Oncology

## 2021-11-17 DIAGNOSIS — R928 Other abnormal and inconclusive findings on diagnostic imaging of breast: Secondary | ICD-10-CM

## 2021-11-17 DIAGNOSIS — N6310 Unspecified lump in the right breast, unspecified quadrant: Secondary | ICD-10-CM

## 2021-11-17 DIAGNOSIS — N6489 Other specified disorders of breast: Secondary | ICD-10-CM

## 2021-12-10 ENCOUNTER — Encounter: Payer: Self-pay | Admitting: Neurology

## 2021-12-10 ENCOUNTER — Encounter: Payer: Self-pay | Admitting: *Deleted

## 2021-12-10 ENCOUNTER — Inpatient Hospital Stay: Payer: 59 | Admitting: Hematology and Oncology

## 2021-12-10 ENCOUNTER — Ambulatory Visit: Payer: 59 | Admitting: Neurology

## 2021-12-10 ENCOUNTER — Telehealth: Payer: Self-pay | Admitting: Hematology and Oncology

## 2021-12-10 VITALS — Ht 63.0 in | Wt 138.0 lb

## 2021-12-10 DIAGNOSIS — R51 Headache with orthostatic component, not elsewhere classified: Secondary | ICD-10-CM | POA: Diagnosis not present

## 2021-12-10 DIAGNOSIS — H539 Unspecified visual disturbance: Secondary | ICD-10-CM | POA: Diagnosis not present

## 2021-12-10 DIAGNOSIS — R42 Dizziness and giddiness: Secondary | ICD-10-CM | POA: Diagnosis not present

## 2021-12-10 MED ORDER — ONDANSETRON 4 MG PO TBDP: 4.0000 mg | ORAL_TABLET | Freq: Three times a day (TID) | ORAL | 3 refills | Status: DC | PRN

## 2021-12-10 MED ORDER — METHYLPREDNISOLONE 4 MG PO TBPK: ORAL_TABLET | ORAL | 1 refills | Status: DC

## 2021-12-10 MED ORDER — RIZATRIPTAN BENZOATE 10 MG PO TBDP: 10.0000 mg | ORAL_TABLET | ORAL | 11 refills | Status: DC | PRN

## 2021-12-10 NOTE — Telephone Encounter (Signed)
.  Called patient to schedule appointment per 7/5 inbasket, patient is aware of date and time.   

## 2021-12-10 NOTE — Progress Notes (Signed)
[facility]    Provider:  Dr Jaynee Eagles Requesting Provider: Lois Huxley, PA Primary Care Provider:  Maude Leriche, PA-C (Inactive)  CC:  dizziness  HPI:  Kathryn Bradley is a 36 y.o. female here as requested by Lois Huxley, PA for dizziness and vertigo.  Past medical history dizziness/vertigo, reflux, abdominal pain, allergic rhinitis, lactose intolerance, depression, anxiety, asthma, migraines.  I reviewed [name] notes, at the beginning of April she presented for discussion of vertigo, 4-day history of intermittent dizziness, similarly to what she was experiencing in May 2022 when she went to the ED, she feels motion sickness when she is looking at a bunch of different objects such as when she is at the grocery store, no room spinning, happening both at rest and with activity, no numbness tingling weakness headaches or vision changes, she did have some fluid in her ears in March 2023 but she took Zyrtec-D with relief, of note similar incident occurred last year after [place] lots of windy roads, had nausea with the symptoms.  She was given meclizine.  Last appointment I see is April 25, dizziness, initially seen April 5 for evaluation of vertigo as above, she was given meclizine to use as needed, and referral was placed to ENT, she reported persistent symptoms which seem to come and go, such as when she was packing a suitcase to go on a trip, she took meclizine which caused severe drowsiness, she then got on an airplane 5 days ago and use scopolamine patch which she has used previously without side effects, she complains of severe fatigue every day since then despite getting 8 hours of sleep at night, she was in [place] riding rides and did not have any difficulty with this, she denied pregnancy, or blood loss.  Examination was normal.  Patient is here and reports: ENT testing was normal at ENT. Started beginning of April  2023. It was the same thing that happened in May 2022 and last ed 8 days very similar and went away it was gone, this one has not. Then beginning of April and she started felling nauseated and sick. She feels she is turning with the room or swaying back and forth like she is drunk. Unsteady. It can be anytime. Better in the morning, positional  Worsening throughout the day almost every day, can be associated with positional headache. She has to blink her eyes and clear her vision, she gets motion sickness, looking at multiple things in a store make it worse. She has had 2-4 migraine auras but never really a migraine headache and these episodes are not ever related to migraines but can be associated with a  unilateral headache. But she has been having headaches but not every time, headaches are not migrainous but can start in the temples, behind the eyes, most days since April, nothing caused the onset. Meclizine makes her very drowsy. The episodes can last hours and days straight, driving does not bother her but any other time she can feel it standing, sitting, laying down helps, sitting completely still helped in the past and sitting makes it better, unknown triggers. No other focal neurologic deficits, associated symptoms, inciting events or modifiable factors.  Reviewed notes, labs and imaging from outside physicians, which showed:  I reviewed her chart, MRI of the brain was ordered April 28th but I do not see results.   Labs were drawn October 01, 2021 with normal TSH, vitamin D was  on the low side at 28.7, CMP was essentially normal with BUN 9 and creatinine 0.71, CBC essentially normal.  Review of Systems: Patient complains of symptoms per HPI as well as the following symptoms dizziness. Pertinent negatives and positives per HPI. All others negative.   Social History   Socioeconomic History   Marital status: Married    Spouse name: Not on file   Number of children: Not on file   Years of  education: Not on file   Highest education level: Associate degree: academic program  Occupational History   Not on file  Tobacco Use   Smoking status: Never   Smokeless tobacco: Never  Vaping Use   Vaping Use: Never used  Substance and Sexual Activity   Alcohol use: No   Drug use: No   Sexual activity: Yes  Other Topics Concern   Not on file  Social History Narrative   Lives at home with her husband and child    Right handed   Caffeine: 1-2 cups/day   Social Determinants of Health   Financial Resource Strain: Low Risk  (02/18/2018)   Overall Financial Resource Strain (CARDIA)    Difficulty of Paying Living Expenses: Not hard at all  Food Insecurity: No Food Insecurity (02/18/2018)   Hunger Vital Sign    Worried About Running Out of Food in the Last Year: Never true    [place] in the Last Year: Never true  Transportation Needs: Unknown (02/18/2018)   PRAPARE - Hydrologist (Medical): No    Lack of Transportation (Non-Medical): Not on file  Physical Activity: Inactive (02/18/2018)   Exercise Vital Sign    Days of Exercise per Week: 0 days    Minutes of Exercise per Session: 0 min  Stress: No Stress Concern Present (02/18/2018)   [place]    Feeling of Stress : Only a little  Social Connections: Not on file  Intimate Partner Violence: Not At Risk (02/18/2018)   Humiliation, Afraid, Rape, and Kick questionnaire    Fear of Current or Ex-Partner: No    Emotionally Abused: No    Physically Abused: No    Sexually Abused: No    Family History  Problem Relation Age of Onset   Breast cancer Mother 38       triple negative   Heart attack Mother    Heart disease Mother    Diabetes Mother    Heart Problems Father 64   Heart disease Father    Diabetes Maternal Grandmother    Ovarian cancer Maternal Grandmother    Macular degeneration Maternal Grandmother    Colon cancer  Maternal Grandmother    Kidney cancer Maternal Grandfather    Bladder Cancer Maternal Grandfather    Skin cancer Maternal Grandfather    Diabetes Maternal Grandfather    Breast cancer Cousin        dx late 24s, mat first cousin   Macular degeneration Maternal Aunt     Past Medical History:  Diagnosis Date   Abdominal pain    Allergic rhinitis    Anxiety    Asthma    Depression    Esophageal reflux    Family history of breast cancer    Family history of colon cancer    Family history of ovarian cancer    Lactose intolerance    Migraine headache     Patient Active Problem List   Diagnosis Date Noted  Other allergic rhinitis 09/23/2020   History of asthma 09/23/2020   Lactose intolerance 09/23/2020   Allergic conjunctivitis of both eyes 09/23/2020   Genetic testing 06/20/2020   Family history of breast cancer    Family history of ovarian cancer    Family history of colon cancer    Labor and delivery, indication for care 02/28/2018    Past Surgical History:  Procedure Laterality Date   [place] TO[place] EXTRACTION  2005    Current Outpatient Medications  Medication Sig Dispense Refill   [employer] SUNSCREEN SPF 30 EX apply     Fexofenadine HCl (ALLEGRA PO) Take 1 tablet by mouth daily.     methylPREDNISolone (MEDROL DOSEPAK) 4 MG TBPK tablet Take pills daily all together with food. Take the first dose (6 pills) as soon as possible. Take the rest each morning. For 6 days total 6-5-4-3-2-1. 21 tablet 1   ondansetron (ZOFRAN-ODT) 4 MG disintegrating tablet Take 1-2 tablets (4-8 mg total) by mouth every 8 (eight) hours as needed. 180 tablet 3   rizatriptan (MAXALT-MLT) 10 MG disintegrating tablet Take 1 tablet (10 mg total) by mouth as needed for migraine. May repeat in 2 hours if needed 9 tablet 11   No current facility-administered medications for this visit.    Allergies as of 12/10/2021 - Review Complete 12/10/2021  Allergen Reaction Noted   Lactose intolerance (gi)   12/10/2021    Vitals: Ht '5\' 3"'$  (1.6 m)   Wt 138 lb (62.6 kg)   LMP 11/24/2021 (Exact Date)   BMI 24.45 kg/m  Last Weight:  Wt Readings from Last 1 Encounters:  12/10/21 138 lb (62.6 kg)   Last Height:   Ht Readings from Last 1 Encounters:  12/10/21 '5\' 3"'$  (1.6 m)     Physical exam: Exam: Gen: NAD, conversant, well nourised, well groomed                     CV: RRR, no MRG. No Carotid Bruits. No peripheral edema, warm, nontender Eyes: Conjunctivae clear without exudates or hemorrhage  Neuro: Detailed Neurologic Exam  Speech:    Speech is normal; fluent and spontaneous with normal comprehension.  Cognition:    The patient is oriented to person, place, and time;     recent and remote memory intact;     language fluent;     normal attention, concentration,     fund of knowledge Cranial Nerves:    The pupils are equal, round, and reactive to light. The fundi are normal and spontaneous venous pulsations are present. Visual fields are full to finger confrontation. Extraocular movements are intact. Trigeminal sensation is intact and the muscles of mastication are normal. The face is symmetric. The palate elevates in the midline. Hearing intact. Voice is normal. Shoulder shrug is normal. The tongue has normal motion without fasciculations.   Coordination:    Normal   Gait:   normal.   Motor Observation:    No asymmetry, no atrophy, and no involuntary movements noted. Tone:    Normal muscle tone.    Posture:    Posture is normal. normal erect    Strength:    Strength is V/V in the upper and lower limbs.      Sensation: intact to LT     Reflex Exam:  DTR's:    Deep tendon reflexes in the upper and lower extremities are normal bilaterally.   Toes:    The toes are downgoing bilaterally.   Clonus:  Clonus is absent.    Assessment/Plan:  36 year old with acute onset dizziness and vertigo. Likely peripheral but need to rule out central causes as well. Ongoing for  3 months. Orthostatic VS: Lying BP 107/66 HR 80 (slightly dizzy, not as bad as usual), Standing @ 3 minutes BP 123/74 HR 104  Medrol dosepak 6 days to see if it helps, could be a labyrinthitis or neuronitis Vestibular therapy - ordered, patient stopped by to make appoitment Ondansetron for symptoms of nausea and dizziness (meclizine causes drowsiness) MRI of the brain w/wo contrast: MRI brain due to concerning symptoms of vertigo, dizziness, positional headaches,vision changes, worsening headaches  to look for space occupying mass, chiari or intracranial hypertension (pseudotumor), schwanomma, strokes, malignancies, vasculidities, demyelination(multiple sclerosis) or other Vestibular migraines? Diagnosis of exclusion. If the above does not work, try treating for migraine. Try Rizatriptan at onset of symptoms  Orders Placed This Encounter  Procedures   MR BRAIN W WO CONTRAST   Ambulatory referral to Physical Therapy   Meds ordered this encounter  Medications   methylPREDNISolone (MEDROL DOSEPAK) 4 MG TBPK tablet    Sig: Take pills daily all together with food. Take the first dose (6 pills) as soon as possible. Take the rest each morning. For 6 days total 6-5-4-3-2-1.    Dispense:  21 tablet    Refill:  1   ondansetron (ZOFRAN-ODT) 4 MG disintegrating tablet    Sig: Take 1-2 tablets (4-8 mg total) by mouth every 8 (eight) hours as needed.    Dispense:  180 tablet    Refill:  3   rizatriptan (MAXALT-MLT) 10 MG disintegrating tablet    Sig: Take 1 tablet (10 mg total) by mouth as needed for migraine. May repeat in 2 hours if needed    Dispense:  9 tablet    Refill:  11    Cc: Lois Huxley, PA,  Scifres, Dorothy, PA-C (Inactive)  Sarina Ill, MD  [facility] [address] [place], [place] [address]  Phone [phone] Fax [phone]

## 2021-12-10 NOTE — Patient Instructions (Addendum)
Medrol dosepak 6 days Vestibular therapy next door Ondansetron for symptoms MRI of the brain w/wo contrast Vestibular migraines? Diagnosis of exclusion. If the above does not work, try treating for migraine. Try Rizatriptan at onset of symptoms   Meds ordered this encounter  Medications   methylPREDNISolone (MEDROL DOSEPAK) 4 MG TBPK tablet    Sig: Take pills daily all together with food. Take the first dose (6 pills) as soon as possible. Take the rest each morning. For 6 days total 6-5-4-3-2-1.    Dispense:  21 tablet    Refill:  1   ondansetron (ZOFRAN-ODT) 4 MG disintegrating tablet    Sig: Take 1-2 tablets (4-8 mg total) by mouth every 8 (eight) hours as needed.    Dispense:  180 tablet    Refill:  3   rizatriptan (MAXALT-MLT) 10 MG disintegrating tablet    Sig: Take 1 tablet (10 mg total) by mouth as needed for migraine. May repeat in 2 hours if needed    Dispense:  9 tablet    Refill:  11     Ondansetron Dissolving Tablets What is this medication? ONDANSETRON (on DAN se tron) prevents nausea and vomiting from chemotherapy, radiation, or surgery. It works by blocking substances in the body that may cause nausea or vomiting. It belongs to a group of medications called antiemetics. This medicine may be used for other purposes; ask your health care provider or pharmacist if you have questions. COMMON BRAND NAME(S): Zofran ODT What should I tell my care team before I take this medication? They need to know if you have any of these conditions: Heart disease History of irregular heartbeat Liver disease Low levels of magnesium or potassium in the blood An unusual or allergic reaction to ondansetron, granisetron, other medications, foods, dyes, or preservatives Pregnant or trying to get pregnant Breast-feeding How should I use this medication? These tablets are made to dissolve in the mouth. Do not try to push the tablet through the foil backing. With dry hands, peel away the  foil backing and gently remove the tablet. Place the tablet in the mouth and allow it to dissolve, then swallow. While you may take these tablets with water, it is not necessary to do so. Talk to your care team regarding the use of this medication in children. Special care may be needed. Overdosage: If you think you have taken too much of this medicine contact a poison control center or emergency room at once. NOTE: This medicine is only for you. Do not share this medicine with others. What if I miss a dose? If you miss a dose, take it as soon as you can. If it is almost time for your next dose, take only that dose. Do not take double or extra doses. What may interact with this medication? Do not take this medication with any of the following: Apomorphine Certain medications for fungal infections like fluconazole, itraconazole, ketoconazole, posaconazole, voriconazole Cisapride Dronedarone Pimozide Thioridazine This medication may also interact with the following: Carbamazepine Certain medications for depression, anxiety, or psychotic disturbances Fentanyl Linezolid MAOIs like Carbex, Eldepryl, Marplan, Nardil, and Parnate Methylene blue (injected into a vein) Other medications that prolong the QT interval (cause an abnormal heart rhythm) like dofetilide, ziprasidone Phenytoin Rifampicin Tramadol This list may not describe all possible interactions. Give your health care provider a list of all the medicines, herbs, non-prescription drugs, or dietary supplements you use. Also tell them if you smoke, drink alcohol, or use illegal drugs. Some items may  interact with your medicine. What should I watch for while using this medication? Check with your care team as soon as you can if you have any sign of an allergic reaction. What side effects may I notice from receiving this medication? Side effects that you should report to your care team as soon as possible: Allergic reactions--skin rash,  itching, hives, swelling of the face, lips, tongue, or throat Bowel blockage--stomach cramping, unable to have a bowel movement or pass gas, loss of appetite, vomiting Chest pain (angina)--pain, pressure, or tightness in the chest, neck, back, or arms Heart rhythm changes--fast or irregular heartbeat, dizziness, feeling faint or lightheaded, chest pain, trouble breathing Irritability, confusion, fast or irregular heartbeat, muscle stiffness, twitching muscles, sweating, high fever, seizure, chills, vomiting, diarrhea, which may be signs of serotonin syndrome Side effects that usually do not require medical attention (report to your care team if they continue or are bothersome): Constipation Diarrhea General discomfort and fatigue Headache This list may not describe all possible side effects. Call your doctor for medical advice about side effects. You may report side effects to FDA at 1-800-FDA-1088. Where should I keep my medication? Keep out of the reach of children and pets. Store between 2 and 30 degrees C (36 and 86 degrees F). Throw away any unused medication after the expiration date. NOTE: This sheet is a summary. It may not cover all possible information. If you have questions about this medicine, talk to your doctor, pharmacist, or health care provider.  2023 Elsevier/Gold Standard (2020-06-28 00:00:00) Rizatriptan Disintegrating Tablets What is this medication? RIZATRIPTAN (rye za TRIP tan) treats migraines. It works by blocking pain signals and narrowing blood vessels in the brain. It belongs to a group of medications called triptans. It is not used to prevent migraines. This medicine may be used for other purposes; ask your health care provider or pharmacist if you have questions. COMMON BRAND NAME(S): Maxalt-MLT What should I tell my care team before I take this medication? They need to know if you have any of these conditions: Cigarette smoker Circulation problems in fingers  and toes Diabetes Heart disease High blood pressure High cholesterol History of irregular heartbeat History of stroke Kidney disease Liver disease Stomach or intestine problems An unusual or allergic reaction to rizatriptan, other medications, foods, dyes, or preservatives Pregnant or trying to get pregnant Breast-feeding How should I use this medication? Take this medication by mouth. Follow the directions on the prescription label. Leave the tablet in the sealed blister pack until you are ready to take it. With dry hands, open the blister and gently remove the tablet. If the tablet breaks or crumbles, throw it away and take a new tablet out of the blister pack. Place the tablet in the mouth and allow it to dissolve, and then swallow. Do not cut, crush, or chew this medication. You do not need water to take this medication. Do not take it more often than directed. Talk to your care team regarding the use of this medication in children. While this medication may be prescribed for children as young as 6 years for selected conditions, precautions do apply. Overdosage: If you think you have taken too much of this medicine contact a poison control center or emergency room at once. NOTE: This medicine is only for you. Do not share this medicine with others. What if I miss a dose? This does not apply. This medication is not for regular use. What may interact with this medication? Do not take  this medication with any of the following medications: Certain medications for migraine headache like almotriptan, eletriptan, frovatriptan, naratriptan, rizatriptan, sumatriptan, zolmitriptan Ergot alkaloids like dihydroergotamine, ergonovine, ergotamine, methylergonovine MAOIs like Carbex, Eldepryl, Marplan, Nardil, and Parnate This medication may also interact with the following medications: Certain medications for depression, anxiety, or psychotic disorders Propranolol This list may not describe all  possible interactions. Give your health care provider a list of all the medicines, herbs, non-prescription drugs, or dietary supplements you use. Also tell them if you smoke, drink alcohol, or use illegal drugs. Some items may interact with your medicine. What should I watch for while using this medication? Visit your care team for regular checks on your progress. Tell your care team if your symptoms do not start to get better or if they get worse. You may get drowsy or dizzy. Do not drive, use machinery, or do anything that needs mental alertness until you know how this medication affects you. Do not stand up or sit up quickly, especially if you are an older patient. This reduces the risk of dizzy or fainting spells. Alcohol may interfere with the effect of this medication. Your mouth may get dry. Chewing sugarless gum or sucking hard candy and drinking plenty of water may help. Contact your care team if the problem does not go away or is severe. If you take migraine medications for 10 or more days a month, your migraines may get worse. Keep a diary of headache days and medication use. Contact your care team if your migraine attacks occur more frequently. What side effects may I notice from receiving this medication? Side effects that you should report to your care team as soon as possible: Allergic reactions--skin rash, itching, hives, swelling of the face, lips, tongue, or throat Burning, pain, tingling, or color changes in the legs or feet Heart attack--pain or tightness in the chest, shoulders, arms, or jaw, nausea, shortness of breath, cold or clammy skin, feeling faint or lightheaded Heart rhythm changes--fast or irregular heartbeat, dizziness, feeling faint or lightheaded, chest pain, trouble breathing Increase in blood pressure Irritability, confusion, fast or irregular heartbeat, muscle stiffness, twitching muscles, sweating, high fever, seizure, chills, vomiting, diarrhea, which may be signs  of serotonin syndrome Raynaud's--cool, numb, or painful fingers or toes that may change color from pale, to blue, to red Seizures Stroke--sudden numbness or weakness of the face, arm, or leg, trouble speaking, confusion, trouble walking, loss of balance or coordination, dizziness, severe headache, change in vision Sudden or severe stomach pain, nausea, vomiting, fever, or bloody diarrhea Vision loss Side effects that usually do not require medical attention (report to your care team if they continue or are bothersome): Dizziness General discomfort or fatigue This list may not describe all possible side effects. Call your doctor for medical advice about side effects. You may report side effects to FDA at 1-800-FDA-1088. Where should I keep my medication? Keep out of the reach of children and pets. Store at room temperature between 15 and 30 degrees C (59 and 86 degrees F). Protect from light and moisture. Throw away any unused medication after the expiration date. NOTE: This sheet is a summary. It may not cover all possible information. If you have questions about this medicine, talk to your doctor, pharmacist, or health care provider.  2023 Elsevier/Gold Standard (2020-07-03 00:00:00) Methylprednisolone Tablets What is this medication? METHYLPREDNISOLONE (meth ill pred NISS oh lone) treats many conditions such as asthma, allergic reactions, arthritis, inflammatory bowel diseases, adrenal, and blood or bone  marrow disorders. It works by decreasing inflammation, slowing down an overactive immune system, or replacing cortisol normally made in the body. Cortisol is a hormone that plays an important role in how the body responds to stress, illness, and injury. It belongs to a group of medications called steroids. This medicine may be used for other purposes; ask your health care provider or pharmacist if you have questions. COMMON BRAND NAME(S): Medrol, Medrol Dosepak What should I tell my care team  before I take this medication? They need to know if you have any of these conditions: Cushing's syndrome Eye disease, vision problems Diabetes Glaucoma Heart disease High blood pressure Infection (especially a virus infection such as chickenpox, cold sores, or herpes) Liver disease Mental illness Myasthenia gravis Osteoporosis Recently received or scheduled to receive a vaccine Seizures Stomach or intestine problems Thyroid disease An unusual or allergic reaction to lactose, methylprednisolone, other medications, foods, dyes, or preservatives Pregnant or trying to get pregnant Breast-feeding How should I use this medication? Take this medication by mouth with a glass of water. Follow the directions on the prescription label. Take this medication with food. If you are taking this medication once a day, take it in the morning. Do not take it more often than directed. Do not suddenly stop taking your medication because you may develop a severe reaction. Your care team will tell you how much medication to take. If your care team wants you to stop the medication, the dose may be slowly lowered over time to avoid any side effects. Talk to your care team about the use of this medication in children. Special care may be needed. Overdosage: If you think you have taken too much of this medicine contact a poison control center or emergency room at once. NOTE: This medicine is only for you. Do not share this medicine with others. What if I miss a dose? If you miss a dose, take it as soon as you can. If it is almost time for your next dose, talk to your care team. You may need to miss a dose or take an extra dose. Do not take double or extra doses without advice. What may interact with this medication? Do not take this medication with any of the following: Alefacept Echinacea Live virus vaccines Metyrapone Mifepristone This medication may also interact with the following: Amphotericin  B Aspirin and aspirin-like medications Certain antibiotics like erythromycin, clarithromycin, troleandomycin Certain medications for diabetes Certain medications for fungal infections like ketoconazole Certain medications for seizures like carbamazepine, phenobarbital, phenytoin Certain medications that treat or prevent blood clots like warfarin Cholestyramine Cyclosporine Digoxin Diuretics Female hormones, like estrogens and birth control pills Isoniazid NSAIDs, medications for pain inflammation, like ibuprofen or naproxen Other medications for myasthenia gravis Rifampin Vaccines This list may not describe all possible interactions. Give your health care provider a list of all the medicines, herbs, non-prescription drugs, or dietary supplements you use. Also tell them if you smoke, drink alcohol, or use illegal drugs. Some items may interact with your medicine. What should I watch for while using this medication? Tell your care team if your symptoms do not start to get better or if they get worse. Do not stop taking except on your care team's advice. You may develop a severe reaction. Your care team will tell you how much medication to take. This medication may increase your risk of getting an infection. Tell your care team if you are around anyone with measles or chickenpox, or if you develop  sores or blisters that do not heal properly. This medication may increase blood sugar levels. Ask your care team if changes in diet or medications are needed if you have diabetes. Tell your care team right away if you have any change in your eyesight. Using this medication for a long time may increase your risk of low bone mass. Talk to your care team about bone health. What side effects may I notice from receiving this medication? Side effects that you should report to your care team as soon as possible: Allergic reactions--skin rash, itching, hives, swelling of the face, lips, tongue, or  throat Cushing syndrome--increased fat around the midsection, upper back, neck, or face, pink or purple stretch marks on the skin, thinning, fragile skin that easily bruises, unexpected hair growth High blood sugar (hyperglycemia)--increased thirst or amount of urine, unusual weakness or fatigue, blurry vision Increase in blood pressure Infection--fever, chills, cough, sore throat, wounds that don't heal, pain or trouble when passing urine, general feeling of discomfort or being unwell Low adrenal gland function--nausea, vomiting, loss of appetite, unusual weakness or fatigue, dizziness Mood and behavior changes--anxiety, nervousness, confusion, hallucinations, irritability, hostility, thoughts of suicide or self-harm, worsening mood, feelings of depression Stomach bleeding--bloody or black, tar-like stools, vomiting blood or brown material that looks like coffee grounds Swelling of the ankles, hands, or feet Side effects that usually do not require medical attention (report to your care team if they continue or are bothersome): Acne General discomfort and fatigue Headache Increase in appetite Nausea Trouble sleeping Weight gain This list may not describe all possible side effects. Call your doctor for medical advice about side effects. You may report side effects to FDA at 1-800-FDA-1088. Where should I keep my medication? Keep out of the reach of children and pets. Store at room temperature between 20 and 25 degrees C (68 and 77 degrees F). Throw away any unused medication after the expiration date. NOTE: This sheet is a summary. It may not cover all possible information. If you have questions about this medicine, talk to your doctor, pharmacist, or health care provider.  2023 Elsevier/Gold Standard (2020-08-20 00:00:00)

## 2021-12-10 NOTE — Progress Notes (Signed)
Orthostatic VS: Lying BP 107/66 HR 80 (slightly dizzy, not as bad as usual) Standing @ 3 minutes BP 123/74 HR 104

## 2021-12-11 ENCOUNTER — Telehealth: Payer: Self-pay | Admitting: Neurology

## 2021-12-11 NOTE — Telephone Encounter (Signed)
[employer] sent to [facility] they obtain auth 

## 2021-12-19 ENCOUNTER — Ambulatory Visit: Payer: 59 | Admitting: Physical Therapy

## 2021-12-24 ENCOUNTER — Encounter: Payer: Self-pay | Admitting: Neurology

## 2021-12-24 ENCOUNTER — Ambulatory Visit
Admission: RE | Admit: 2021-12-24 | Discharge: 2021-12-24 | Disposition: A | Discharge status: Home / Self Care | Payer: 59 | Source: Ambulatory Visit | Attending: Neurology | Admitting: Neurology

## 2021-12-24 DIAGNOSIS — R42 Dizziness and giddiness: Secondary | ICD-10-CM | POA: Diagnosis not present

## 2021-12-24 DIAGNOSIS — R51 Headache with orthostatic component, not elsewhere classified: Secondary | ICD-10-CM | POA: Diagnosis not present

## 2021-12-24 DIAGNOSIS — H539 Unspecified visual disturbance: Secondary | ICD-10-CM | POA: Diagnosis not present

## 2021-12-24 MED ORDER — GADOBENATE DIMEGLUMINE 529 MG/ML IV SOLN
13.0000 mL | Freq: Once | INTRAVENOUS | Status: AC | PRN
  Administered 2021-12-24: 13 mL via INTRAVENOUS

## 2021-12-25 ENCOUNTER — Ambulatory Visit: Payer: 59 | Attending: Neurology | Admitting: Physical Therapy

## 2021-12-25 DIAGNOSIS — R42 Dizziness and giddiness: Secondary | ICD-10-CM | POA: Diagnosis not present

## 2021-12-25 NOTE — Therapy (Signed)
OUTPATIENT PHYSICAL THERAPY VESTIBULAR EVALUATION     Patient Name: Kathryn Bradley MRN: [id number] DOB:01/20/1986, 36 y.o., female Today's Date: 12/25/2021  PCP: Maude Leriche, PA-C REFERRING PROVIDER: Melvenia Beam, MD   PT End of Session - 12/25/21 1402     Visit Number 1    Number of Visits 5    Date for PT Re-Evaluation 01/24/22    Authorization Type [employer]    PT Start Time 1312    PT Stop Time 0932    PT Time Calculation (min) 44 min    Activity Tolerance Patient tolerated treatment well    Behavior During Therapy WFL for tasks assessed/performed             Past Medical History:  Diagnosis Date   Abdominal pain    Allergic rhinitis    Anxiety    Asthma    Depression    Esophageal reflux    Family history of breast cancer    Family history of colon cancer    Family history of ovarian cancer    Lactose intolerance    Migraine headache    Past Surgical History:  Procedure Laterality Date   WISDOM TOOTH EXTRACTION  2005   Patient Active Problem List   Diagnosis Date Noted   Other allergic rhinitis 09/23/2020   History of asthma 09/23/2020   Lactose intolerance 09/23/2020   Allergic conjunctivitis of both eyes 09/23/2020   Genetic testing 06/20/2020   Family history of breast cancer    Family history of ovarian cancer    Family history of colon cancer    Labor and delivery, indication for care 02/28/2018    ONSET DATE: 12/10/2021  REFERRING DIAG: R42 (ICD-10-CM) - Vertigo R42 (ICD-10-CM) - Dizziness    THERAPY DIAG:  Dizziness and giddiness  Rationale for Evaluation and Treatment Rehabilitation  SUBJECTIVE:   SUBJECTIVE STATEMENT:  First weekend of April started getting really dizzy. Last May had 8 days where she was dizzy, but it went away. This time it started off really bad and it has lingered. Happens later in the day - sometimes when she will go to the grocery store and will start getting motion sickness. Feels like she is "drunk" and  head will feel like it is spinning. The start of both of these episodes was when [place] was [place] the [place] or [place] the mountains. Was sick with a virus a month and a half ago - wasn't dizzy when she was sick but then the dizziness came afterwards. Gets migraines with auras, but doesn't need to take medications. Tried taking meclizine and it made her very drowsy so had to stop taking it. Has had more headaches since April. Reports does not usually feel imbalanced. No issues with driving. Reports motion sickness started about 8-9 years ago.   Pt accompanied by: self  PERTINENT HISTORY:  Past medical history dizziness/vertigo, reflux, abdominal pain, allergic rhinitis, lactose intolerance, depression, anxiety, asthma, migraines.  ENT testing was normal from visit in May.    PAIN:  Are you having pain? No  PRECAUTIONS: None  FALLS: Has patient fallen in last 6 months? No  PLOF: Independent and Vocation/Vocational requirements: Works for [employer]  PATIENT GOALS Figure out if there is something that triggers her dizziness or what to do to help it.   OBJECTIVE:   DIAGNOSTIC FINDINGS:   MRI 12/24/21: IMPRESSION: This MRI of the brain with and without contrast shows the following: Single small T2/flair hyperintense focus in the subcortical  white matter of the right frontal lobe.  This does not appear to be acute and does not enhance.  However, it was not present on the 08/31/2017 MRI.  It likely represents a small focus of gliosis due to migraine or very minimal chronic microvascular ischemic change.  The brain was otherwise normal. Normal enhancement pattern.  No acute findings.  COGNITION: Overall cognitive status: Within functional limits for tasks assessed   SENSATION: WFL  POSTURE: No Significant postural limitations   TRANSFERS: Assistive device utilized: None  Sit to stand: Complete Independence Stand to sit: Complete Independence  GAIT: Gait pattern: step  through pattern Distance walked: Clinic distances. Assistive device utilized: None Level of assistance: Complete Independence    PATIENT SURVEYS:  Academic librarian did not capture at eval.    VESTIBULAR ASSESSMENT   GENERAL OBSERVATION: Ambulates in with no AD.    SYMPTOM BEHAVIOR:   Subjective history: See above.   Non-Vestibular symptoms: headaches, tinnitus, and migraine symptoms   Type of dizziness: Imbalance (Disequilibrium), Spinning/Vertigo, and would be a slow spinning, motion sickness, "feels off"    Frequency: frequently, but not everyday, couple times a week.    Duration: a few hours    Aggravating factors:  happened when she was packing and bending down, being in the grocery store.    Relieving factors: closing eyes and sitting, grounding herself.    Progression of symptoms: better   OCULOMOTOR EXAM:   Ocular Alignment: normal   Ocular ROM: No Limitations   Spontaneous Nystagmus: absent   Gaze-Induced Nystagmus: absent   Smooth Pursuits: intact   Saccades: intact     VESTIBULAR - OCULAR REFLEX:    Slow VOR: Normal   VOR Cancellation: Normal   Head-Impulse Test: HIT Right: negative HIT Left: negative   Dynamic Visual Acuity: Static: Line 11 Dynamic: Line 9  Mild dizziness afterwards, feels "off"    POSITIONAL TESTING: Right Dix-Hallpike: no nystagmus Left Dix-Hallpike: no nystagmus Right Roll Test: no nystagmus Left Roll Test: no nystagmus    MOTION SENSITIVITY:    Motion Sensitivity Quotient  Intensity: 0 = none, 1 = Lightheaded, 2 = Mild, 3 = Moderate, 4 = Severe, 5 = Vomiting  Intensity  1. Sitting to supine 0  2. Supine to L side 0  3. Supine to R side 0  4. Supine to sitting 0  5. L Hallpike-Dix 0  6. Up from L  0  7. R Hallpike-Dix 0  8. Up from R  0  9. Sitting, head  tipped to L knee 0  10. Head up from L  knee 0  11. Sitting, head  tipped to R knee 0  12. Head up from R  knee 0  13. Standing head turns x5 2 (feels like still in  motion)  14.Sitting head nods x5 2 (feels like still in motion)  15. In stance, 180  turn to L  0  16. In stance, 180  turn to R 0    Bending over and head motions  performed from standing.    FUNCTIONAL GAIT:  MCTSIB:  Condition 1:  30 sec Condition 2: 30 sec Condition 3: 30 sec  Condition 4: 30 sec, mild postural sway    [facility] PT Assessment - 12/25/21 1350       Functional Gait  Assessment   Gait assessed  Yes    Gait Level Surface Walks 20 ft in less than 5.5 sec, no assistive devices, good speed, no evidence for imbalance,  normal gait pattern, deviates no more than 6 in outside of the 12 in walkway width.    Change in Gait Speed Able to smoothly change walking speed without loss of balance or gait deviation. Deviate no more than 6 in outside of the 12 in walkway width.    Gait with Horizontal Head Turns Performs head turns smoothly with no change in gait. Deviates no more than 6 in outside 12 in walkway width   1/10 dizziness   Gait with Vertical Head Turns Performs head turns with no change in gait. Deviates no more than 6 in outside 12 in walkway width.   1/10 dizziness   Gait and Pivot Turn Pivot turns safely within 3 sec and stops quickly with no loss of balance.    Step Over Obstacle Is able to step over 2 stacked shoe boxes taped together (9 in total height) without changing gait speed. No evidence of imbalance.    Gait with Narrow Base of Support Is able to ambulate for 10 steps heel to toe with no staggering.    Gait with Eyes Closed Walks 20 ft, no assistive devices, good speed, no evidence of imbalance, normal gait pattern, deviates no more than 6 in outside 12 in walkway width. Ambulates 20 ft in less than 7 sec.    Ambulating Backwards Walks 20 ft, no assistive devices, good speed, no evidence for imbalance, normal gait    Steps Alternating feet, no rail.    Total Score 30              VESTIBULAR TREATMENT: N/A during eval.   PATIENT EDUCATION: Education  details: Clinical findings, POC  Person educated: Patient Education method: Explanation Education comprehension: verbalized understanding   GOALS: Goals reviewed with patient? Yes  SHORT TERM GOALS: Target date: ALL STGS  = LTGS   LONG TERM GOALS: Target date: 01/22/2022    Pt will be independent with final HEP for vestibular deficits in order to build upon functional gains made in therapy.  Baseline:  Goal status: INITIAL  2.  FOTO goal to be written as appropriate.  Baseline: Staff did not capture at eval.  Goal status: INITIAL  3.  Pt will rate performing head motions 0/5 on MSQ in order to demo improved motion sensitivity.  Baseline:  Goal status: INITIAL  4.  SOT goal to be assessed and written as appropriate.  Baseline:  Goal status: INITIAL  5.  Pt will perform DVA with continued 2 line difference or less with no dizziness in order to demo improved VOR Baseline: 2 line difference and mild dizziness/feeling "off" Goal status: INITIAL  ASSESSMENT:  CLINICAL IMPRESSION: Patient is a 36 year old female referred to Neuro OPPT for dizziness.   Pt's PMH is significant for: dizziness/vertigo, reflux, abdominal pain, allergic rhinitis, lactose intolerance, depression, anxiety, asthma, migraines. The following deficits were present during the exam: visual motion sensitivity with head motions, mild postural sway with balance with EC, incr dizziness after DVA testing. Pt is not at a fall risk based on FGA, pt scored a 30/30. Pt would benefit from skilled PT to address these impairments to improve functional mobility and decr dizziness.     OBJECTIVE IMPAIRMENTS decreased activity tolerance and decreased balance.   ACTIVITY LIMITATIONS  N/A  PARTICIPATION LIMITATIONS: shopping   PERSONAL FACTORS Time since onset of injury/illness/exacerbation and 1 comorbidity: hx of migraines  are also affecting patient's functional outcome.   REHAB POTENTIAL: Good  CLINICAL DECISION  MAKING: Stable/uncomplicated  EVALUATION COMPLEXITY: Low   PLAN: PT FREQUENCY: 1x/week  PT DURATION: 4 weeks  PLANNED INTERVENTIONS: Therapeutic exercises, Therapeutic activity, Neuromuscular re-education, Balance training, Gait training, Patient/Family education, Self Care, Vestibular training, and Visual/preceptual remediation/compensation  PLAN FOR NEXT SESSION: Perform SOT and write goal based on findings. Initial HEP for balance/VOR/motion sensitivity - head motions, EC on compliant surfaces, VOR    Arliss Journey, PT, DPT  12/25/2021, 2:46 PM

## 2021-12-25 NOTE — Telephone Encounter (Signed)
Pt view mychart result note seen 12-24-2021 at 1102.

## 2021-12-31 ENCOUNTER — Other Ambulatory Visit: Payer: 59

## 2021-12-31 ENCOUNTER — Ambulatory Visit: Payer: 59 | Admitting: Physical Therapy

## 2022-01-07 ENCOUNTER — Ambulatory Visit: Payer: 59

## 2022-01-09 ENCOUNTER — Telehealth: Payer: Self-pay | Admitting: Hematology and Oncology

## 2022-01-09 NOTE — Telephone Encounter (Signed)
Per 8/4 phone call pt called to r/s appointment r/s per pt request

## 2022-01-12 ENCOUNTER — Ambulatory Visit: Payer: 59 | Admitting: Physical Therapy

## 2022-01-13 ENCOUNTER — Inpatient Hospital Stay: Payer: 59 | Admitting: Hematology and Oncology

## 2022-01-16 ENCOUNTER — Encounter: Payer: Self-pay | Admitting: Physical Therapy

## 2022-01-16 ENCOUNTER — Ambulatory Visit: Payer: 59 | Attending: Neurology | Admitting: Physical Therapy

## 2022-01-16 DIAGNOSIS — R42 Dizziness and giddiness: Secondary | ICD-10-CM | POA: Diagnosis present

## 2022-01-16 NOTE — Therapy (Signed)
OUTPATIENT PHYSICAL THERAPY VESTIBULAR TREATMENT     Patient Name: Kathryn Bradley MRN: [id number] DOB:1985/12/18, 36 y.o., female Today's Date: 01/16/2022  PCP: Maude Leriche, PA-C REFERRING PROVIDER: Melvenia Beam, MD   PT End of Session - 01/16/22 1404     Visit Number 2    Number of Visits 5    Date for PT Re-Evaluation 01/24/22    Authorization Type [employer]et[employer]    PT Start Time 1403    PT Stop Time 6314    PT Time Calculation (min) 40 min    Activity Tolerance Patient tolerated treatment well    Behavior During Therapy WFL for tasks assessed/performed             Past Medical History:  Diagnosis Date   Abdominal pain    Allergic rhinitis    Anxiety    Asthma    Depression    Esophageal reflux    Family history of breast cancer    Family history of colon cancer    Family history of ovarian cancer    Lactose intolerance    Migraine headache    Past Surgical History:  Procedure Laterality Date   WISDOM TOOTH EXTRACTION  2005   Patient Active Problem List   Diagnosis Date Noted   Other allergic rhinitis 09/23/2020   History of asthma 09/23/2020   Lactose intolerance 09/23/2020   Allergic conjunctivitis of both eyes 09/23/2020   Genetic testing 06/20/2020   Family history of breast cancer    Family history of ovarian cancer    Family history of colon cancer    Labor and delivery, indication for care 02/28/2018    ONSET DATE: 12/10/2021  REFERRING DIAG: R42 (ICD-10-CM) - Vertigo R42 (ICD-10-CM) - Dizziness    THERAPY DIAG:  Dizziness and giddiness  Rationale for Evaluation and Treatment Rehabilitation  SUBJECTIVE:   SUBJECTIVE STATEMENT:  Nothing new, symptoms come and go.   Pt accompanied by: self  PERTINENT HISTORY:  Past medical history dizziness/vertigo, reflux, abdominal pain, allergic rhinitis, lactose intolerance, depression, anxiety, asthma, migraines.  ENT testing was normal from visit in May.   PAIN:  Are you having pain?  No  PRECAUTIONS: None   PATIENT GOALS Figure out if there is something that triggers her dizziness or what to do to help it.   OBJECTIVE:    VESTIBULAR ASSESSMENT  Conditions: 1: 3 trials WNL 2: 2 trials WNL 3:  1 trial WNL 4: 1 trial WNL 5: 2 trials WNL 6: 2 trials WNL Composite score: 65 (below age related norms) Sensory Analysis Som: below normal (~80) Vis: Below normal (~60) Vest: WNL (~55) Pref: WNL Strategy analysis: Ankle > hip strategy COG alignment: Pt with [place] (staff did not originally capture at eval) DFS: 51, DPS: 60.7    VESTIBULAR TREATMENT: Access Code: [email or web address] URL: [email or web address] Date: 01/16/2022 Prepared by: Janann August  Initiated HEP for balance. See MedBridge for further details.  Exercises - Romberg Stance Eyes Closed on Foam Pad  - 1-2 x daily - 5 x weekly - 3 sets - 30 hold  - Narrow Stance with Eyes Closed and Head Nods on Foam Pad  - 1-2 x daily - 5 x weekly - 2 sets - 10 reps - performed 2 sets of 10 reps head turns, 2 sets of 10 reps head nods  - Romberg Stance with Head Nods on Foam Pad  - 1-2 x daily - 5 x weekly - 2 sets - 10 reps -  performed 2 sets of 10 reps head turns, 2 sets of 10 reps head nods. Pt with mild dizziness with head nods that subsided quickly.  Pt with mild postural sway with exercises.   PATIENT EDUCATION: Education details: Results of SOT testing, initial HEP for visual motion sensitivity with head motions, EC/incr vestibular input for balance. Purpose of each exercise and balance system. Scheduling more appts.  Person educated: Patient Education method: Explanation Education comprehension: verbalized understanding   GOALS: Goals reviewed with patient? Yes  SHORT TERM GOALS: Target date: ALL STGS  = LTGS   LONG TERM GOALS: Target date: 01/22/2022    Pt will be independent with final HEP for vestibular deficits in order to build upon functional gains made in  therapy.  Baseline:  Goal status: INITIAL  2.  Pt will improve DFS to at least a 57 in order to demo improved functional outcomes.  Baseline: 51 Goal status: INITIAL  3.  Pt will rate performing head motions 0/5 on MSQ in order to demo improved motion sensitivity.  Baseline:  Goal status: INITIAL  4.  Pt will improve composite score to at least a 70 to be WNL for age related norms and vestibular/vision sensory analysis to WNL.  Baseline: All below age related norms.  Goal status: REVISED  5.  Pt will perform DVA with continued 2 line difference or less with no dizziness in order to demo improved VOR Baseline: 2 line difference and mild dizziness/feeling "off" Goal status: INITIAL  ASSESSMENT:  CLINICAL IMPRESSION: Performed SOT today (see above for more details). Pt's composite score was below normal and sensory analysis for somatosensory, visual, or vestibular system are below normal limits. LTG updated as appropriate. Remainder of session focused on adding to HEP for balance for incr vestibular input and visual motion sensitivity with head motions. Pt demonstrated more postural sway with EC and head motions, and unable to perform with feet all the way together. Will continue to progress towards LTGs.     OBJECTIVE IMPAIRMENTS decreased activity tolerance and decreased balance.   ACTIVITY LIMITATIONS  N/A  PARTICIPATION LIMITATIONS: shopping   PERSONAL FACTORS Time since onset of injury/illness/exacerbation and 1 comorbidity: hx of migraines  are also affecting patient's functional outcome.   REHAB POTENTIAL: Good  CLINICAL DECISION MAKING: Stable/uncomplicated  EVALUATION COMPLEXITY: Low   PLAN: PT FREQUENCY: 1x/week  PT DURATION: 4 weeks  PLANNED INTERVENTIONS: Therapeutic exercises, Therapeutic activity, Neuromuscular re-education, Balance training, Gait training, Patient/Family education, Self Care, Vestibular training, and Visual/preceptual  remediation/compensation  PLAN FOR NEXT SESSION: Add to HEP for VOR. Work on head motions, EC on compliant surfaces. Rockerboard. Optokinetic exercises   Arliss Journey, PT, DPT  01/16/2022, 2:44 PM

## 2022-01-19 ENCOUNTER — Encounter: Payer: Self-pay | Admitting: Physical Therapy

## 2022-01-19 ENCOUNTER — Ambulatory Visit: Payer: 59 | Admitting: Physical Therapy

## 2022-01-19 DIAGNOSIS — R42 Dizziness and giddiness: Secondary | ICD-10-CM

## 2022-01-19 NOTE — Patient Instructions (Addendum)
Gaze Stabilization: Sitting    Keeping eyes on target on wall a few feet away, tilt head down 15-30 and move head side to side for __30__ seconds. Repeat while moving head up and down for __30__ seconds. Perform 3 sets of each.  Do _2___ sessions per day.   Gaze Stabilization: Tip Card  1.Target must remain in focus, not blurry, and appear stationary while head is in motion. 2.Perform exercises with small head movements (45 to either side of midline). 3.Increase speed of head motion so long as target is in focus. 4.If you wear eyeglasses, be sure you can see target through lens (therapist will give specific instructions for bifocal / progressive lenses). 5.These exercises may provoke dizziness or nausea. Work through these symptoms. If too dizzy, slow head movement slightly. Rest between each exercise. 6.Exercises demand concentration; avoid distractions. 7.For safety, perform standing exercises close to a counter, wall, corner, or next to someone.  Copyright  VHI. All rights reserved.   Copyright  VHI. All rights reserved.

## 2022-01-19 NOTE — Therapy (Signed)
OUTPATIENT PHYSICAL THERAPY VESTIBULAR TREATMENT/RE-CERT     Patient Name: Kathryn Bradley MRN: [id number] DOB:1985/07/21, 36 y.o., female Today's Date: 01/19/2022  PCP: Maude Leriche, PA-C REFERRING PROVIDER: Melvenia Beam, MD   PT End of Session - 01/19/22 1319     Visit Number 3    Number of Visits 7    Date for PT Re-Evaluation 02/18/22    Authorization Type [employer]    PT Start Time 1318    PT Stop Time 1400    PT Time Calculation (min) 42 min    Activity Tolerance Patient tolerated treatment well    Behavior During Therapy WFL for tasks assessed/performed             Past Medical History:  Diagnosis Date   Abdominal pain    Allergic rhinitis    Anxiety    Asthma    Depression    Esophageal reflux    Family history of breast cancer    Family history of colon cancer    Family history of ovarian cancer    Lactose intolerance    Migraine headache    Past Surgical History:  Procedure Laterality Date   WISDOM TOOTH EXTRACTION  2005   Patient Active Problem List   Diagnosis Date Noted   Other allergic rhinitis 09/23/2020   History of asthma 09/23/2020   Lactose intolerance 09/23/2020   Allergic conjunctivitis of both eyes 09/23/2020   Genetic testing 06/20/2020   Family history of breast cancer    Family history of ovarian cancer    Family history of colon cancer    Labor and delivery, indication for care 02/28/2018    ONSET DATE: 12/10/2021  REFERRING DIAG: R42 (ICD-10-CM) - Vertigo R42 (ICD-10-CM) - Dizziness    THERAPY DIAG:  Dizziness and giddiness  Rationale for Evaluation and Treatment Rehabilitation  SUBJECTIVE:   SUBJECTIVE STATEMENT:  Felt a little off after the SOT. Ordered a balance pad for exercises at home. Felt like when she was bending over with her head tilted.   Pt accompanied by: self  PERTINENT HISTORY:  Past medical history dizziness/vertigo, reflux, abdominal pain, allergic rhinitis, lactose intolerance, depression,  anxiety, asthma, migraines.  ENT testing was normal from visit in May.   PAIN:  Are you having pain? No  PRECAUTIONS: None   PATIENT GOALS Figure out if there is something that triggers her dizziness or what to do to help it.   OBJECTIVE:   VESTIBULAR TREATMENT  Gaze Adaptation:   x1 Viewing Horizontal: Position: Seated, Time: 30 seconds, Reps: 3, and Comment: 2-3/10 dizziness.  and x1 Viewing Vertical:  Position: Seated, Time: 30 seconds, Reps: 3, and Comment: 1-2/10 dizziness.  "Feels like eyes want to keep moving".   Added to HEP.   NMR:  Rockerboard: in A/P direction EO: x10 reps head turns, x10 reps head nods, EC keeping board steady x30 seconds, EC 2 sets of 10 reps head turns, 2 sets of 10 reps head nods. Mild dizziness with EC and head motions and needing intermittent support to bars for balance.   Static marching with EC on level ground x10 reps, then performed x10 reps on blue mat, progressed to 3 sets of walking marching with EC on blue mat. Pt with no dizziness, just feeling more off balance.   Performed habituation with bending all the way to floor and picking up cone and coming back upright x6 reps quickly, pt with no dizziness when performing.   Forward gait with holding ball and performing  CW and CCW circles with tracking with head and eyes, performed 3 x 30' of each direction. Pt reporting 3/10 dizziness afterwards when moving head as well as eyes. Needing intermittent rest breaks afterwards.   PATIENT EDUCATION: Education details: Addition of VOR exercises to HEP. Having pt follow-up with her ENT as she reports that they did not do any vestibular testing with her (reports just went to audiologist and reports ENT just looked in her ears) as they wanted her to see a neurologist first before they did further testing (pt has then see Dr. Jaynee Eagles).  Person educated: Patient Education method: Explanation and Handouts Education comprehension: verbalized understanding  and returned demonstration   GOALS: Goals reviewed with patient? Yes  SHORT TERM GOALS: Target date: ALL STGS  = LTGS   LONG TERM GOALS: Target date: 01/22/2022    Pt will be independent with final HEP for vestibular deficits in order to build upon functional gains made in therapy.  Baseline:  Goal status: INITIAL  2.  Pt will improve DFS to at least a 57 in order to demo improved functional outcomes.  Baseline: 51 Goal status: INITIAL  3.  Pt will rate performing head motions 0/5 on MSQ in order to demo improved motion sensitivity.  Baseline:  Goal status: INITIAL  4.  Pt will improve composite score to at least a 70 to be WNL for age related norms and vestibular/vision sensory analysis to WNL.  Baseline: All below age related norms.  Goal status: REVISED  5.  Pt will perform DVA with continued 2 line difference or less with no dizziness in order to demo improved VOR Baseline: 2 line difference and mild dizziness/feeling "off" Goal status: INITIAL   ONGOING LTG DATE FOR RE-CERT LONG TERM GOALS: Target date: 02/18/22  Pt will be independent with final HEP for vestibular deficits in order to build upon functional gains made in therapy.  Baseline:  Goal status: INITIAL  2.  Pt will improve DFS to at least a 57 in order to demo improved functional outcomes.  Baseline: 51 Goal status: INITIAL  3.  Pt will rate performing head motions 0/5 on MSQ in order to demo improved motion sensitivity.  Baseline:  Goal status: INITIAL  4.  Pt will improve composite score to at least a 70 to be WNL for age related norms and vestibular/vision sensory analysis to WNL.  Baseline: All below age related norms.  Goal status: REVISED  5.  Pt will perform DVA with continued 2 line difference or less with no dizziness in order to demo improved VOR Baseline: 2 line difference and mild dizziness/feeling "off" Goal status: INITIAL  ASSESSMENT:  CLINICAL IMPRESSION: Today's skilled  session focused on initiating VOR exercises for home, balance on compliant surfaces and with head motions, and visual tracking. Pt with mild dizziness after seated VOR - worse in the horizontal direction. Pt with mild dizziness when ambulating and performing visual tracking/head movements with a ball. Re-cert performed today for an additional 4 weeks. Pt had to miss a few visits due to scheduling conflicts and this is only her 2nd treatment session since eval so LTGs not assessed. SOT was performed at previous session and pt below age related norms for composite score and sensory analysis for somatosensory, vision, and vestibular systems for balance. All LTGs are on-going. Will continue to progress towards LTGs.     OBJECTIVE IMPAIRMENTS decreased activity tolerance and decreased balance.   ACTIVITY LIMITATIONS  N/A  PARTICIPATION LIMITATIONS: shopping  PERSONAL FACTORS Time since onset of injury/illness/exacerbation and 1 comorbidity: hx of migraines  are also affecting patient's functional outcome.   REHAB POTENTIAL: Good  CLINICAL DECISION MAKING: Stable/uncomplicated  EVALUATION COMPLEXITY: Low   PLAN: PT FREQUENCY: 1x/week  PT DURATION: 4 weeks  PLANNED INTERVENTIONS: Therapeutic exercises, Therapeutic activity, Neuromuscular re-education, Balance training, Gait training, Patient/Family education, Self Care, Vestibular training, and Visual/preceptual remediation/compensation  PLAN FOR NEXT SESSION: Progress VOR. Work on head motions, EC on compliant surfaces. Rockerboard. Visual tracking/head motion exercises.    Arliss Journey, PT, DPT  01/19/2022, 2:00 PM

## 2022-01-29 ENCOUNTER — Ambulatory Visit: Payer: 59 | Admitting: Physical Therapy

## 2022-01-30 ENCOUNTER — Telehealth: Payer: Self-pay

## 2022-01-30 NOTE — Telephone Encounter (Signed)
S/w pt to make her aware as of right now her insurance is not approving her MRI. Our PA team is filing an appeal and will let us know if a P2P will be needed. Pt states she will call to r/s the appt to give time for it to be approved.

## 2022-02-02 ENCOUNTER — Other Ambulatory Visit: Payer: 59

## 2022-02-04 ENCOUNTER — Encounter: Payer: Self-pay | Admitting: Neurology

## 2022-02-04 ENCOUNTER — Ambulatory Visit: Payer: 59 | Admitting: Physical Therapy

## 2022-02-04 ENCOUNTER — Telehealth: Payer: 59 | Admitting: Neurology

## 2022-02-04 ENCOUNTER — Telehealth: Payer: Self-pay | Admitting: *Deleted

## 2022-02-04 ENCOUNTER — Encounter: Payer: Self-pay | Admitting: Physical Therapy

## 2022-02-04 ENCOUNTER — Telehealth: Payer: Self-pay | Admitting: Neurology

## 2022-02-04 ENCOUNTER — Inpatient Hospital Stay: Payer: 59 | Admitting: Hematology and Oncology

## 2022-02-04 DIAGNOSIS — G43809 Other migraine, not intractable, without status migrainosus: Secondary | ICD-10-CM

## 2022-02-04 DIAGNOSIS — R42 Dizziness and giddiness: Secondary | ICD-10-CM | POA: Diagnosis not present

## 2022-02-04 MED ORDER — TOPIRAMATE 50 MG PO TABS: 50.0000 mg | ORAL_TABLET | Freq: Every evening | ORAL | 3 refills | Status: DC

## 2022-02-04 NOTE — Telephone Encounter (Signed)
Message in [place] about appt.

## 2022-02-04 NOTE — Therapy (Signed)
OUTPATIENT PHYSICAL THERAPY VESTIBULAR TREATMENT     Patient Name: Kathryn Bradley MRN: [id number] DOB:[birth year], 36 y.o., female Today's Date: 02/04/2022  PCP: Maude Leriche, PA-C REFERRING PROVIDER: Melvenia Beam, MD   PT End of Session - 02/04/22 [phone]     Visit Number 4    Number of Visits 7    Date for PT Re-Evaluation 02/18/22    Authorization Type [employer]    PT Start Time 0802    PT Stop Time 0842    PT Time Calculation (min) 40 min    Activity Tolerance Patient tolerated treatment well    Behavior During Therapy [facility] for tasks assessed/performed             Past Medical History:  Diagnosis Date   Abdominal pain    Allergic rhinitis    Anxiety    Asthma    Depression    Esophageal reflux    Family history of breast cancer    Family history of colon cancer    Family history of ovarian cancer    Lactose intolerance    Migraine headache    Past Surgical History:  Procedure Laterality Date   WISDOM TOOTH EXTRACTION  2005   Patient Active Problem List   Diagnosis Date Noted   Other allergic rhinitis 09/23/2020   History of asthma 09/23/2020   Lactose intolerance 09/23/2020   Allergic conjunctivitis of both eyes 09/23/2020   Genetic testing 06/20/2020   Family history of breast cancer    Family history of ovarian cancer    Family history of colon cancer    Labor and delivery, indication for care 02/28/2018    ONSET DATE: 12/10/2021  REFERRING DIAG: R42 (ICD-10-CM) - Vertigo R42 (ICD-10-CM) - Dizziness    THERAPY DIAG:  Dizziness and giddiness  Rationale for Evaluation and Treatment Rehabilitation  SUBJECTIVE:   SUBJECTIVE STATEMENT:  Reports dizziness has not been as bad, but it still comes and goes. Tried the VOR exercise at home.   Pt accompanied by: self  PERTINENT HISTORY:  Past medical history dizziness/vertigo, reflux, abdominal pain, allergic rhinitis, lactose intolerance, depression, anxiety, asthma, migraines.  ENT testing was  normal from visit in May.   PAIN:  Are you having pain? No  PRECAUTIONS: None   PATIENT GOALS Figure out if there is something that triggers her dizziness or what to do to help it.   OBJECTIVE:   VESTIBULAR TREATMENT  Gaze Adaptation:   x1 Viewing Horizontal: Position: Seated, Time: 30 seconds, 2 sets of 60 seconds, Reps: 3 total, and Comment: No dizziness with 30 seconds, mild dizziness after 60 seconds.  and x1 Viewing Vertical:  Position: Seated, Time: 30 seconds, 2 sets of 60 seconds, Reps: 3 total, and Comment: No dizziness for 30 seconds, mild dizziness     NMR:   Standing Balance: Surface: Airex Position: Narrow Base of Support Completed with: Eyes Closed; 2 sets of 10 reps head turns, 2 sets of 10 reps head nods. Mild dizziness, mild postural sway. Upgraded to HEP for feet together.   Eyes open, feet together on air ex:  -x10 reps diagonal head motions each direction. -Holding ball and tracking horizontally with head/eyes x10 reps, no dizziness -CW and CCW circles x10 reps each direction, moving head/eyes, losing balance at times into the wall, 2/10 dizziness afterwards.    Bouncing on blue physioball for improved otolith function: gentle bouncing x30 seconds keeping eyes focused on the X, gentle bouncing with head turns x5 reps and head nods  x5 reps. Mild dizziness. Progressed to 10 reps in each direction, 2/10 dizziness with horizontal, 2-3/10 dizziness with vertical.   Rockerboard: in M/L direction: weight shifting with eyes closed 2 x 30 seconds, cues for incr weight shift.   Forward gait with holding ball and performing diagonals 2 sets of 10 reps with tracking with head and eyes, performed 2 x 30' of each direction. Pt reporting 1-2/10 dizziness afterwards. More difficulty with diagonals starting going up to R.   PATIENT EDUCATION: Education details: VOR progression to HEP.  Person educated: Patient Education method: Explanation Education comprehension:  verbalized understanding and returned demonstration   GOALS: Goals reviewed with patient? Yes  SHORT TERM GOALS: Target date: ALL STGS  = LTGS     ONGOING LTG DATE FOR RE-CERT LONG TERM GOALS: Target date: 02/18/22  Pt will be independent with final HEP for vestibular deficits in order to build upon functional gains made in therapy.  Baseline:  Goal status: INITIAL  2.  Pt will improve DFS to at least a 57 in order to demo improved functional outcomes.  Baseline: 51 Goal status: INITIAL  3.  Pt will rate performing head motions 0/5 on MSQ in order to demo improved motion sensitivity.  Baseline:  Goal status: INITIAL  4.  Pt will improve composite score to at least a 70 to be WNL for age related norms and vestibular/vision sensory analysis to WNL.  Baseline: All below age related norms.  Goal status: REVISED  5.  Pt will perform DVA with continued 2 line difference or less with no dizziness in order to demo improved VOR Baseline: 2 line difference and mild dizziness/feeling "off" Goal status: INITIAL  ASSESSMENT:  CLINICAL IMPRESSION: Was able to progress seated VOR today to 60 seconds with pt having mild dizziness afterwards. Upgraded to pt's HEP. Remainder of session continued to focus on balance strategies for incr vestibular input and visual tracking. Pt with mild symptoms throughout, reported most dizziness after bouncing on physioball with head motions. Pt able to tolerate well, will continue to progress towards LTGs.      OBJECTIVE IMPAIRMENTS decreased activity tolerance and decreased balance.   ACTIVITY LIMITATIONS  N/A  PARTICIPATION LIMITATIONS: shopping   PERSONAL FACTORS Time since onset of injury/illness/exacerbation and 1 comorbidity: hx of migraines  are also affecting patient's functional outcome.   REHAB POTENTIAL: Good  CLINICAL DECISION MAKING: Stable/uncomplicated  EVALUATION COMPLEXITY: Low   PLAN: PT FREQUENCY: 1x/week  PT DURATION: 4  weeks  PLANNED INTERVENTIONS: Therapeutic exercises, Therapeutic activity, Neuromuscular re-education, Balance training, Gait training, Patient/Family education, Self Care, Vestibular training, and Visual/preceptual remediation/compensation  PLAN FOR NEXT SESSION: Progress VOR. Work on head motions, EC on compliant surfaces. Rockerboard. Visual tracking/head motion exercises.    Arliss Journey, PT, DPT  02/04/2022, 10:13 AM

## 2022-02-04 NOTE — Patient Instructions (Addendum)
Medications: try Topiramate as prevention for migraines, take rizatriptan at onset of migraine or aura or dizziness. Propranolol or other blood pressure medications are contraindicated due to hypotension. Would next try Nortriptyline if Topiramate doesn't work. After this could try nurtec or ubrelvy or qulipta. Has tried sumatriptan and rizatriptan.   Mychart me if you want to try increase topiramate to '100mg'$  or try the next migraine medication.   Meds ordered this encounter  Medications   topiramate (TOPAMAX) 50 MG tablet    Sig: Take 1 tablet (50 mg total) by mouth at bedtime.    Dispense:  30 tablet    Refill:  3     There is increased risk for stroke in women with migraine with aura and a contraindication for the combined contraceptive pill for use by women who have migraine with aura. The risk for women with migraine without aura is lower. However other risk factors like smoking are far more likely to increase stroke risk than migraine. There is a recommendation for no smoking and for the use of OCPs without estrogen such as progestogen only pills particularly for women with migraine with aura.Marland Kitchen People who have migraine headaches with auras may be 3 times more likely to have a stroke caused by a blood clot, compared to migraine patients who don't see auras. Women who take hormone-replacement therapy may be 30 percent more likely to suffer a clot-based stroke than women not taking medication containing estrogen. Other risk factors like smoking and high blood pressure may be  much more important.  Topiramate Tablets What is this medication? TOPIRAMATE (toe PYRE a mate) prevents and controls seizures in people with epilepsy. It may also be used to prevent migraine headaches. It works by calming overactive nerves in your body. This medicine may be used for other purposes; ask your health care provider or pharmacist if you have questions. COMMON BRAND NAME(S): Topamax, Topiragen What should I  tell my care team before I take this medication? They need to know if you have any of these conditions: Bleeding disorder Kidney disease Lung disease Suicidal thoughts, plans, or attempt by you or a family member An unusual or allergic reaction to topiramate, other medications, foods, dyes, or preservatives Pregnant or trying to get pregnant Breast-feeding How should I use this medication? Take this medication by mouth with water. Take it as directed on the prescription label at the same time every day. Do not cut, crush or chew this medicine. Swallow the tablets whole. You can take it with or without food. If it upsets your stomach, take it with food. Keep taking it unless your care team tells you to stop. A special MedGuide will be given to you by the pharmacist with each prescription and refill. Be sure to read this information carefully each time. Talk to your care team about the use of this medication in children. While it may be prescribed for children as young as 2 years for selected conditions, precautions do apply. Overdosage: If you think you have taken too much of this medicine contact a poison control center or emergency room at once. NOTE: This medicine is only for you. Do not share this medicine with others. What if I miss a dose? If you miss a dose, take it as soon as you can unless it is within 6 hours of the next dose. If it is within 6 hours of the next dose, skip the missed dose. Take the next dose at the normal time. Do not take  double or extra doses. What may interact with this medication? Acetazolamide Alcohol Antihistamines for allergy, cough, and cold Aspirin and aspirin-like medications Atropine Certain medications for anxiety or sleep Certain medications for bladder problems, such as oxybutynin, tolterodine Certain medications for depression, such as amitriptyline, fluoxetine, sertraline Certain medications for Parkinson disease, such as benztropine,  trihexyphenidyl Certain medications for seizures, such as carbamazepine, lamotrigine, phenobarbital, phenytoin, primidone, valproic acid, zonisamide Certain medications for stomach problems, such as dicyclomine, hyoscyamine Certain medications for travel sickness, such as scopolamine Certain medications that treat or prevent blood clots, such as warfarin, enoxaparin, dalteparin, apixaban, dabigatran, rivaroxaban Digoxin Diltiazem Estrogen and progestin hormones General anesthetics, such as halothane, isoflurane, methoxyflurane, propofol Glyburide Hydrochlorothiazide Ipratropium Lithium Medications that relax muscles Metformin NSAIDs, medications for pain and inflammation, such as ibuprofen or naproxen Opioid medications for pain Phenothiazines, such as chlorpromazine, mesoridazine, prochlorperazine, thioridazine Pioglitazone This list may not describe all possible interactions. Give your health care provider a list of all the medicines, herbs, non-prescription drugs, or dietary supplements you use. Also tell them if you smoke, drink alcohol, or use illegal drugs. Some items may interact with your medicine. What should I watch for while using this medication? Visit your care team for regular checks on your progress. Tell your care team if your symptoms do not start to get better or if they get worse. Do not suddenly stop taking this medication. You may develop a severe reaction. Your care team will tell you how much medication to take. If your care team wants you to stop the medication, the dose may be slowly lowered over time to avoid any side effects. Wear a medical ID bracelet or chain. Carry a card that describes your condition. List the medications and doses you take on the card. This medication may affect your coordination, reaction time, or judgment. Do not drive or operate machinery until you know how this medication affects you. Sit up or stand slowly to reduce the risk of dizzy or  fainting spells. Drinking alcohol with this medication can increase the risk of these side effects. This medication may cause serious skin reactions. They can happen weeks to months after starting the medication. Contact your care team right away if you notice fevers or flu-like symptoms with a rash. The rash may be red or purple and then turn into blisters or peeling of the skin. You may also notice a red rash with swelling of the face, lips, or lymph nodes in your neck or under your arms. This medication may cause thoughts of suicide or depression. This includes sudden changes in mood, behaviors, or thoughts. These changes can happen at any time but are more common in the beginning of treatment or after a change in dose. Call your care team right away if you experience these thoughts or worsening depression. This medication may slow your child's growth if it is taken for a long time at high doses. Your child's care team will monitor your child's growth. Using this medication for a long time may weaken your bones. The risk of bone fractures may be increased. Talk to your care team about your bone health. Discuss this medication with your care team if you may be pregnant. Serious birth defects can occur if you take this medication during pregnancy. There are benefits and risks to taking medications during pregnancy. Your care team can help you find the option that works for you. Contraception is recommended while taking this medication. Estrogen and progestin hormones may not work as  well while you are taking this medication. Your care team can help you find the option that works for you. Talk to your care team before breastfeeding. Changes to your treatment plan may be needed. What side effects may I notice from receiving this medication? Side effects that you should report to your care team as soon as possible: Allergic reactions--skin rash, itching, hives, swelling of the face, lips, tongue, or  throat High acid level--trouble breathing, unusual weakness or fatigue, confusion, headache, fast or irregular heartbeat, nausea, vomiting High ammonia level--unusual weakness or fatigue, confusion, loss of appetite, nausea, vomiting, seizures Fever that does not go away, decrease in sweat Kidney stones--blood in the urine, pain or trouble passing urine, pain in the lower back or sides Redness, blistering, peeling or loosening of the skin, including inside the mouth Sudden eye pain or change in vision such as blurry vision, seeing halos around lights, vision loss Thoughts of suicide or self-harm, worsening mood, feelings of depression Side effects that usually do not require medical attention (report to your care team if they continue or are bothersome): Burning or tingling sensation in hands or feet Difficulty with paying attention, memory, or speech Dizziness Drowsiness Fatigue Loss of appetite with weight loss Slow or sluggish movements of the body This list may not describe all possible side effects. Call your doctor for medical advice about side effects. You may report side effects to FDA at 1-800-FDA-1088. Where should I keep my medication? Keep out of the reach of children and pets. Store between 15 and 30 degrees C (59 and 86 degrees F). Protect from moisture. Keep the container tightly closed. Get rid of any unused medication after the expiration date. To get rid of medications that are no longer needed or have expired: Take the medication to a medication take-back program. Check with your pharmacy or law enforcement to find a location. If you cannot return the medication, check the label or package insert to see if the medication should be thrown out in the garbage or flushed down the toilet. If you are not sure, ask your care team. If it is safe to put it in the trash, empty the medication out of the container. Mix the medication with cat litter, dirt, coffee grounds, or other  unwanted substance. Seal the mixture in a bag or container. Put it in the trash. NOTE: This sheet is a summary. It may not cover all possible information. If you have questions about this medicine, talk to your doctor, pharmacist, or health care provider.  2023 Elsevier/Gold Standard (2007-07-16 00:00:00)

## 2022-02-04 NOTE — Progress Notes (Signed)
[facility]    Provider:  Dr Jaynee Eagles Requesting Provider: No ref. provider found Primary Care Provider:  Scifres, Earlie Server, PA-C (Inactive)  CC:  dizziness  Virtual Visit via Video Note  I connected with [name] on 02/04/22 at  9:30 AM EDT by a video enabled telemedicine application and verified that I am speaking with the correct person using two identifiers.  Location: Patient: home Provider: office   I discussed the limitations of evaluation and management by telemedicine and the availability of in person appointments. The patient expressed understanding and agreed to proceed.  Follow Up Instructions:    I discussed the assessment and treatment plan with the patient. The patient was provided an opportunity to ask questions and all were answered. The patient agreed with the plan and demonstrated an understanding of the instructions.   The patient was advised to call back or seek an in-person evaluation if the symptoms worsen or if the condition fails to improve as anticipated.  I provided 30 minutes of non-face-to-face time during this encounter.   Melvenia Beam, MD   02/04/2022: She has been ondansetron, vertigo is getting better, doing the vestibular therapy, she is having spots in the eyes both eyes then the spots get bigger in one eye can last a coupld minutes to 30 minutes, she tried ibuprofen which worked but other times has turned into a headache. The steroid pack did help. She has 4 migraine days a month. Pounding/pulsating/throbbing, photophobia/phonophobia, can be unilateral and move, behind the right eye, nausea, no vomiting, can last 4-12 hours, laying down helps, discussed rizatriptan. But her dizziness/vertigo has not ever happened during a migraine. Discussed vestibular migraines.  MRI brain : reviewed images and agree, discussed with patient, unremarkable IMPRESSION: This MRI of the brain with and without contrast shows the  following: Single small T2/flair hyperintense focus in the subcortical white matter of the right frontal lobe.  This does not appear to be acute and does not enhance.  However, it was not present on the 08/31/2017 MRI.  It likely represents a small focus of gliosis due to migraine or very minimal chronic microvascular ischemic change.  The brain was otherwise normal. Normal enhancement pattern.  No acute findings.   Medications: try Topiramate as prevention for migraines, take rizatriptan at onset of migraine or aura or dizziness. Propranolol or other blood pressure medications are contraindicated due to hypotension. Would next try Nortriptyline if Topiramate doesn't work. After this could try nurtec or ubrelvy or qulipta. Has tried sumatriptan and rizatriptan.   HPI:  Kathryn Bradley is a 36 y.o. female here as requested by No ref. provider found for dizziness and vertigo.  Past medical history dizziness/vertigo, reflux, abdominal pain, allergic rhinitis, lactose intolerance, depression, anxiety, asthma, migraines.  I reviewed [name] notes, at the beginning of April she presented for discussion of vertigo, 4-day history of intermittent dizziness, similarly to what she was experiencing in May 2022 when she went to the ED, she feels motion sickness when she is looking at a bunch of different objects such as when she is at the grocery store, no room spinning, happening both at rest and with activity, no numbness tingling weakness headaches or vision changes, she did have some fluid in her ears in March 2023 but she took Zyrtec-D with relief, of note similar incident occurred last year after [place] lots of windy roads, had nausea with the symptoms.  She was given meclizine.  Last  appointment I see is April 25, dizziness, initially seen April 5 for evaluation of vertigo as above, she was given meclizine to use as needed, and referral was placed to ENT, she reported  persistent symptoms which seem to come and go, such as when she was packing a suitcase to go on a trip, she took meclizine which caused severe drowsiness, she then got on an airplane 5 days ago and use scopolamine patch which she has used previously without side effects, she complains of severe fatigue every day since then despite getting 8 hours of sleep at night, she was in [place] riding rides and did not have any difficulty with this, she denied pregnancy, or blood loss.  Examination was normal.  Patient is here and reports: ENT testing was normal at ENT. Started beginning of April 2023. It was the same thing that happened in May 2022 and last ed 8 days very similar and went away it was gone, this one has not. Then beginning of April and she started felling nauseated and sick. She feels she is turning with the room or swaying back and forth like she is drunk. Unsteady. It can be anytime. Better in the morning, positional  Worsening throughout the day almost every day, can be associated with positional headache. She has to blink her eyes and clear her vision, she gets motion sickness, looking at multiple things in a store make it worse. She has had 2-4 migraine auras but never really a migraine headache and these episodes are not ever related to migraines but can be associated with a  unilateral headache. But she has been having headaches but not every time, headaches are not migrainous but can start in the temples, behind the eyes, most days since April, nothing caused the onset. Meclizine makes her very drowsy. The episodes can last hours and days straight, driving does not bother her but any other time she can feel it standing, sitting, laying down helps, sitting completely still helped in the past and sitting makes it better, unknown triggers. No other focal neurologic deficits, associated symptoms, inciting events or modifiable factors.  Reviewed notes, labs and imaging from outside  physicians, which showed:  I reviewed her chart, MRI of the brain was ordered April 28th but I do not see results.   Labs were drawn October 01, 2021 with normal TSH, vitamin D was on the low side at 28.7, CMP was essentially normal with BUN 9 and creatinine 0.71, CBC essentially normal.  Review of Systems: Patient complains of symptoms per HPI as well as the following symptoms dizziness. Pertinent negatives and positives per HPI. All others negative.   Social History   Socioeconomic History   Marital status: Married    Spouse name: Not on file   Number of children: Not on file   Years of education: Not on file   Highest education level: Associate degree: academic program  Occupational History   Not on file  Tobacco Use   Smoking status: Never   Smokeless tobacco: Never  Vaping Use   Vaping Use: Never used  Substance and Sexual Activity   Alcohol use: No   Drug use: No   Sexual activity: Yes  Other Topics Concern   Not on file  Social History Narrative   Lives at home with her husband and child    Right handed   Caffeine: 1-2 cups/day   Social Determinants of Health   Financial Resource Strain: Low Risk  (02/18/2018)   Overall  Financial Resource Strain (CARDIA)    Difficulty of Paying Living Expenses: Not hard at all  Food Insecurity: No Food Insecurity (02/18/2018)   Hunger Vital Sign    Worried About Running Out of Food in the Last Year: Never true    Ran Out of Food in the Last Year: Never true  Transportation Needs: Unknown (02/18/2018)   PRAPARE - Hydrologist (Medical): No    Lack of Transportation (Non-Medical): Not on file  Physical Activity: Inactive (02/18/2018)   Exercise Vital Sign    Days of Exercise per Week: 0 days    Minutes of Exercise per Session: 0 min  Stress: No Stress Concern Present (02/18/2018)   Keith    Feeling of Stress : Only a little  Social  Connections: Not on file  Intimate Partner Violence: Not At Risk (02/18/2018)   Humiliation, Afraid, Rape, and Kick questionnaire    Fear of Current or Ex-Partner: No    Emotionally Abused: No    Physically Abused: No    Sexually Abused: No    Family History  Problem Relation Age of Onset   Breast cancer Mother 12       triple negative   Heart attack Mother    Heart disease Mother    Diabetes Mother    Heart Problems Father 52   Heart disease Father    Diabetes Maternal Grandmother    Ovarian cancer Maternal Grandmother    Macular degeneration Maternal Grandmother    Colon cancer Maternal Grandmother    Kidney cancer Maternal Grandfather    Bladder Cancer Maternal Grandfather    Skin cancer Maternal Grandfather    Diabetes Maternal Grandfather    Breast cancer Cousin        dx late 77s, mat first cousin   Macular degeneration Maternal Aunt     Past Medical History:  Diagnosis Date   Abdominal pain    Allergic rhinitis    Anxiety    Asthma    Depression    Esophageal reflux    Family history of breast cancer    Family history of colon cancer    Family history of ovarian cancer    Lactose intolerance    Migraine headache     Patient Active Problem List   Diagnosis Date Noted   Other allergic rhinitis 09/23/2020   History of asthma 09/23/2020   Lactose intolerance 09/23/2020   Allergic conjunctivitis of both eyes 09/23/2020   Genetic testing 06/20/2020   Family history of breast cancer    Family history of ovarian cancer    Family history of colon cancer    Labor and delivery, indication for care 02/28/2018    Past Surgical History:  Procedure Laterality Date   WISDOM TOOTH EXTRACTION  2005    Current Outpatient Medications  Medication Sig Dispense Refill   topiramate (TOPAMAX) 50 MG tablet Take 1 tablet (50 mg total) by mouth at bedtime. 30 tablet 3   [facility] SUNSCREEN SPF 30 EX apply     Fexofenadine HCl (ALLEGRA PO) Take 1 tablet by mouth daily.      methylPREDNISolone (MEDROL DOSEPAK) 4 MG TBPK tablet Take pills daily all together with food. Take the first dose (6 pills) as soon as possible. Take the rest each morning. For 6 days total 6-5-4-3-2-1. 21 tablet 1   ondansetron (ZOFRAN-ODT) 4 MG disintegrating tablet Take 1-2 tablets (4-8 mg total) by mouth every 8 (  eight) hours as needed. 180 tablet 3   rizatriptan (MAXALT-MLT) 10 MG disintegrating tablet Take 1 tablet (10 mg total) by mouth as needed for migraine. May repeat in 2 hours if needed 9 tablet 11   No current facility-administered medications for this visit.    Allergies as of 02/04/2022 - Review Complete 02/04/2022  Allergen Reaction Noted   Lactose intolerance (gi)  12/10/2021    Vitals: There were no vitals taken for this visit. Last Weight:  Wt Readings from Last 1 Encounters:  12/10/21 138 lb (62.6 kg)   Last Height:   Ht Readings from Last 1 Encounters:  12/10/21 '5\' 3"'$  (1.6 m)    Physical exam: Exam: Gen: NAD, conversant      CV:  Denies palpitations or chest pain or SOB. VS: Breathing at a normal rate. Weight appears within normal limits. Not febrile. Eyes: Conjunctivae clear without exudates or hemorrhage  Neuro: Detailed Neurologic Exam  Speech:    Speech is normal; fluent and spontaneous with normal comprehension.  Cognition:    The patient is oriented to person, place, and time;     recent and remote memory intact;     language fluent;     normal attention, concentration,     fund of knowledge Cranial Nerves:    The pupils are equal, round, and reactive to light. Visual fields are full to finger confrontation. Extraocular movements are intact.  The face is symmetric with normal sensation. The palate elevates in the midline. Hearing intact. Voice is normal. Shoulder shrug is normal. The tongue has normal motion without fasciculations.   Coordination:    Normal finger to nose  Gait:    Normal native gait  Motor Observation:   no involuntary  movements noted. Tone:    Appears normal  Posture:    Posture is normal. normal erect    Strength:    Strength is anti-gravity and symmetric in the upper and lower limbs.      Sensation: intact to LT     Assessment/Plan:  36 year old with acute onset dizziness and vertigo. Likely peripheral but need to rule out central causes as well. Ongoing for 3 months. Orthostatic VS: Lying BP 107/66 HR 80 (slightly dizzy, not as bad as usual), Standing @ 3 minutes BP 123/74 HR 104. She is improved but still having symptoms and what may be an aura, discussed risk of stroke in women with migraine with aura, decided to try a migraine preventative for possible vestibular migrianes.  Medrol dosepak 6 days to see if it helps, could be a labyrinthitis or neuronitis: helped Vestibular therapy - ordered, patient stopped by to make appointment: is helping Ondansetron for symptoms of nausea and dizziness (meclizine causes drowsiness): helps MRI of the brain w/wo contrast: unremarkable Vestibular migraines? Diagnosis of exclusion.  Try Rizatriptan at onset of symptoms  Medications: try Topiramate as prevention for migraines, take rizatriptan at onset of migraine or aura or dizziness. Propranolol or other blood pressure medications are contraindicated due to hypotension. Would next try Nortriptyline if Topiramate doesn't work. After this could try nurtec or ubrelvy or qulipta. Has tried sumatriptan and rizatriptan.   Mychart me if you want to try increase topiramate to '100mg'$  or try the next migraine medication.   Meds ordered this encounter  Medications   topiramate (TOPAMAX) 50 MG tablet    Sig: Take 1 tablet (50 mg total) by mouth at bedtime.    Dispense:  30 tablet    Refill:  3  Cc: No ref. provider found,  Scifres, Dorothy, PA-C (Inactive)  Sarina Ill, MD  [facility] [address] [place], [place] [address]  Phone [phone] Fax [phone]

## 2022-02-04 NOTE — Telephone Encounter (Signed)
Please call patient and schedule video visit in 3 months with me thanks

## 2022-02-10 ENCOUNTER — Other Ambulatory Visit: Payer: Self-pay | Admitting: *Deleted

## 2022-02-10 ENCOUNTER — Telehealth: Payer: Self-pay | Admitting: *Deleted

## 2022-02-10 NOTE — Telephone Encounter (Signed)
This RN called pt to inform her of need to cancel MRI of her breast per her high risk follow up- per insurance and guidelines for MRI's of breast - she does not meet the needed criteria.  Pt verbalized understanding and plan to proceed as scheduled with the mammo and U/S in December.  This RN called the [facility] as well, spoke with [place] and canceled the appt.  Order will be d/ced as well.

## 2022-02-11 ENCOUNTER — Ambulatory Visit: Payer: 59 | Attending: Neurology | Admitting: Physical Therapy

## 2022-02-11 ENCOUNTER — Encounter: Payer: Self-pay | Admitting: Physical Therapy

## 2022-02-11 DIAGNOSIS — R42 Dizziness and giddiness: Secondary | ICD-10-CM | POA: Diagnosis present

## 2022-02-11 NOTE — Therapy (Signed)
OUTPATIENT PHYSICAL THERAPY VESTIBULAR TREATMENT     Patient Name: Kathryn Bradley MRN: [id number] DOB:September 03, 1985, 36 y.o., female Today's Date: 02/11/2022  PCP: Maude Leriche, PA-C REFERRING PROVIDER: Melvenia Beam, MD   PT End of Session - 02/11/22 0849     Visit Number 5    Number of Visits 7    Date for PT Re-Evaluation 02/18/22    Authorization Type [employer]    PT Start Time 0848    PT Stop Time 0929    PT Time Calculation (min) 41 min    Activity Tolerance Patient tolerated treatment well    Behavior During Therapy [facility] for tasks assessed/performed             Past Medical History:  Diagnosis Date   Abdominal pain    Allergic rhinitis    Anxiety    Asthma    Depression    Esophageal reflux    Family history of breast cancer    Family history of colon cancer    Family history of ovarian cancer    Lactose intolerance    Migraine headache    Past Surgical History:  Procedure Laterality Date   WISDOM TOOTH EXTRACTION  2005   Patient Active Problem List   Diagnosis Date Noted   Other allergic rhinitis 09/23/2020   History of asthma 09/23/2020   Lactose intolerance 09/23/2020   Allergic conjunctivitis of both eyes 09/23/2020   Genetic testing 06/20/2020   Family history of breast cancer    Family history of ovarian cancer    Family history of colon cancer    Labor and delivery, indication for care 02/28/2018    ONSET DATE: 12/10/2021  REFERRING DIAG: R42 (ICD-10-CM) - Vertigo R42 (ICD-10-CM) - Dizziness    THERAPY DIAG:  Dizziness and giddiness  Rationale for Evaluation and Treatment Rehabilitation  SUBJECTIVE:   SUBJECTIVE STATEMENT:  Has an ear infection and got started on medication and reports it has been getting better. Had a visit with Dr. Jaynee Eagles and is being prescribed migraine medications (has not picked them up yet). Dizziness has been fine. Going to [place] did not bring on his symptoms.   Pt accompanied by: self  PERTINENT HISTORY:   Past medical history dizziness/vertigo, reflux, abdominal pain, allergic rhinitis, lactose intolerance, depression, anxiety, asthma, migraines.  ENT testing was normal from visit in May.   PAIN:  Are you having pain? No  PRECAUTIONS: None   PATIENT GOALS Figure out if there is something that triggers her dizziness or what to do to help it.   OBJECTIVE:   VESTIBULAR TREATMENT  Gaze Adaptation:   x1 Viewing Horizontal: Position: Standing with feet apart > feet together, Time: 30 seconds, 2 sets of 60 seconds. Comment: No dizziness with 30 seconds, mild dizziness after 60 seconds with feet apart/together    and x1 Viewing Vertical:Standing with feet apart > feet together, Time: 30 seconds, 2 sets of 60 seconds. Comment: Mild dizziness with 30 seconds, mild dizziness after 60 seconds with feet apart/together     NMR:   On air ex: EC 5 reps sit <> stands, 10 reps mini squats with EC with feet together.  Tandem gait down and back on foam beam: holding tandem position and performing head turn, head nod, or diagonal head movement. Down and back x2 reps. 1/10 dizziness afterwards. Pt did well with balance.  Bouncing on trampoline keeping eyes focus on target 3 x 15 seconds, x30 seconds (2.5-3/10 dizziness) with pt improving with incr reps/time, performed  bouncing and quick 180 degree turns 2 sets of 5 reps to R/L, mild sx and pt reporting improved sx with incr reps.   Tandem Stance:  Surface: Airex Completed with: Eyes Closed;   Time: 3/4 tandem with static hold x30 seconds each side, then adding in 10 reps head turns, 10 reps head nods with each side EC   Standing Balance: Surface:  Thick Blue Foam Position: Narrow Base of Support EC 2 x 30 seconds, EC with marching x10 reps.       PATIENT EDUCATION: Education details: VOR progression to HEP with feet together in standing, verbally added standing marching with EC to HEP on foam.  Person educated: Patient Education method:  Explanation Education comprehension: verbalized understanding and returned demonstration  HEP: Standing VOR x60 seconds with feet together, [id number]   GOALS: Goals reviewed with patient? Yes  SHORT TERM GOALS: Target date: ALL STGS  = LTGS     ONGOING LTG DATE FOR RE-CERT LONG TERM GOALS: Target date: 02/18/22  Pt will be independent with final HEP for vestibular deficits in order to build upon functional gains made in therapy.  Baseline:  Goal status: INITIAL  2.  Pt will improve DFS to at least a 57 in order to demo improved functional outcomes.  Baseline: 51 Goal status: INITIAL  3.  Pt will rate performing head motions 0/5 on MSQ in order to demo improved motion sensitivity.  Baseline:  Goal status: INITIAL  4.  Pt will improve composite score to at least a 70 to be WNL for age related norms and vestibular/vision sensory analysis to WNL.  Baseline: All below age related norms.  Goal status: REVISED  5.  Pt will perform DVA with continued 2 line difference or less with no dizziness in order to demo improved VOR Baseline: 2 line difference and mild dizziness/feeling "off" Goal status: INITIAL  ASSESSMENT:  CLINICAL IMPRESSION: Was able to progress VOR to standing with feet together for 60 seconds with pt having mild dizziness after. Continued to work on balance with incr vestibular input with EC, head motions, and for improved otolith function on trampoline. Pt with most dizziness today with bouncing on trampoline with keeping eyes focused on a target, but sx improved with incr reps.  Pt able to tolerate well, will continue to progress towards LTGs.      OBJECTIVE IMPAIRMENTS decreased activity tolerance and decreased balance.   ACTIVITY LIMITATIONS  N/A  PARTICIPATION LIMITATIONS: shopping   PERSONAL FACTORS Time since onset of injury/illness/exacerbation and 1 comorbidity: hx of migraines  are also affecting patient's functional outcome.   REHAB POTENTIAL:  Good  CLINICAL DECISION MAKING: Stable/uncomplicated  EVALUATION COMPLEXITY: Low   PLAN: PT FREQUENCY: 1x/week  PT DURATION: 4 weeks  PLANNED INTERVENTIONS: Therapeutic exercises, Therapeutic activity, Neuromuscular re-education, Balance training, Gait training, Patient/Family education, Self Care, Vestibular training, and Visual/preceptual remediation/compensation  PLAN FOR NEXT SESSION: check LTGs. Progress VOR. Work on head motions, EC on compliant surfaces. Rockerboard. Visual tracking/head motion exercises.    Arliss Journey, PT, DPT  02/11/2022, 9:30 AM

## 2022-02-18 ENCOUNTER — Encounter: Payer: Self-pay | Admitting: Physical Therapy

## 2022-02-18 ENCOUNTER — Ambulatory Visit: Payer: 59 | Admitting: Physical Therapy

## 2022-02-18 DIAGNOSIS — R42 Dizziness and giddiness: Secondary | ICD-10-CM

## 2022-02-18 NOTE — Therapy (Signed)
OUTPATIENT PHYSICAL THERAPY VESTIBULAR TREATMENT/RE-CERT     Patient Name: Kathryn Bradley MRN: [id number] DOB:[birth year], 36 y.o., female Today's Date: 02/18/2022  PCP: Maude Leriche, PA-C REFERRING PROVIDER: Melvenia Beam, MD   PT End of Session - 02/18/22 0804     Visit Number 6    Number of Visits 10    Date for PT Re-Evaluation 03/20/22    Authorization Type [employer]    PT Start Time 0803    PT Stop Time 0845    PT Time Calculation (min) 42 min    Activity Tolerance Patient tolerated treatment well    Behavior During Therapy [facility] for tasks assessed/performed             Past Medical History:  Diagnosis Date   Abdominal pain    Allergic rhinitis    Anxiety    Asthma    Depression    Esophageal reflux    Family history of breast cancer    Family history of colon cancer    Family history of ovarian cancer    Lactose intolerance    Migraine headache    Past Surgical History:  Procedure Laterality Date   WISDOM TOOTH EXTRACTION  2005   Patient Active Problem List   Diagnosis Date Noted   Other allergic rhinitis 09/23/2020   History of asthma 09/23/2020   Lactose intolerance 09/23/2020   Allergic conjunctivitis of both eyes 09/23/2020   Genetic testing 06/20/2020   Family history of breast cancer    Family history of ovarian cancer    Family history of colon cancer    Labor and delivery, indication for care 02/28/2018    ONSET DATE: 12/10/2021  REFERRING DIAG: R42 (ICD-10-CM) - Vertigo R42 (ICD-10-CM) - Dizziness    THERAPY DIAG:  Dizziness and giddiness  Rationale for Evaluation and Treatment Rehabilitation  SUBJECTIVE:   SUBJECTIVE STATEMENT:  Had a good bday weekend. Thinks ear infection is better. Had a little dizziness yesterday, but reports that it is better when grounding herself.  Pt accompanied by: self  PERTINENT HISTORY:  Past medical history dizziness/vertigo, reflux, abdominal pain, allergic rhinitis, lactose intolerance,  depression, anxiety, asthma, migraines.  ENT testing was normal from visit in May.   PAIN:  Are you having pain? No  PRECAUTIONS: None   PATIENT GOALS Figure out if there is something that triggers her dizziness or what to do to help it.   OBJECTIVE:   VESTIBULAR ASSESSMENT  MSQ: Standing head turns x5: 1/5 Standing head nods x5: 1/5  FOTO: DFS: 59% DPS: 63.6%  Dynamic Visual Acuity: Static: Line 11 Dynamic: Line 8            Mild-mod dizziness afterwards with sx lingering afterwards     Conditions: 1: 3 trials WNL 2: 3 trials WNL 3:  2 trials WNL 4: 3 trials WNL 5: 2 trials WNL 6: 3 trials WNL Composite score: 47 (above age related norms) Sensory Analysis Som: above normal (~95) Vis: above normal (~80) Vest: above normal (~65) Pref: WNL Strategy analysis: Ankle > hip strategy COG alignment: Pt with Center COG     VESTIBULAR TREATMENT Gaze Adaptation:   x1 Viewing Horizontal: Position: Standing with feet apart with busy background, Time: 30 seconds, 60 seconds Comment: Mild dizziness   and x1 Viewing Vertical: Position: Standing with feet apart with busy background, Time: 30 seconds, 60 seconds Comment: Mild dizziness      PATIENT EDUCATION: Education details: Results of LTGs, progressing VOR x60 in standing with busy  background, POC going forwards.   Person educated: Patient Education method: Customer service manager Education comprehension: verbalized understanding and returned demonstration  HEP: Standing VOR x60 seconds with feet apart and busy background, [id number]   GOALS: Goals reviewed with patient? Yes  SHORT TERM GOALS: Target date: ALL STGS  = LTGS   LONG TERM GOALS: Target date: 02/18/22  Pt will be independent with final HEP for vestibular deficits in order to build upon functional gains made in therapy.  Baseline: pt currently independent with initial HEP.  Goal status: MET  2.  Pt will improve DFS to at least a 57 in  order to demo improved functional outcomes.  Baseline: 51; 59 on 02/18/22 Goal status: MET   3.  Pt will rate performing head motions 0/5 on MSQ in order to demo improved motion sensitivity.  Baseline: 1/5 dizziness on 02/18/22 Goal status: NOT MET  4.  Pt will improve composite score to at least a 70 to be WNL for age related norms and vestibular/vision sensory analysis to WNL.  Baseline: All above age related norms.  Goal status: MET   5.  Pt will perform DVA with continued 2 line difference or less with no dizziness in order to demo improved VOR Baseline: 2 line difference and mild dizziness/feeling "off"; 3 line difference and mild dizziness with lingering sx on 02/18/22 Goal status: NOT MET    UPDATED LTGS FOR RE-CERT LONG TERM GOALS: Target date: 03/20/22  Pt will be independent with final HEP for vestibular deficits in order to build upon functional gains made in therapy.  Baseline: pt currently independent with initial HEP, will benefit from updates/additions  Goal status: ON-GOING   2.  Pt will perform 10 reps head turns and 10 reps head nods in standing with no dizziness in order to demo improved motion sensitivity.  Baseline: 1/5 dizziness with 5 reps on 02/18/22 Goal status: NEW  3.  Pt will perform DVA with 2 line difference or less with mild to no dizziness in order to demo improved VOR Baseline: 2 line difference and mild dizziness/feeling "off"; 3 line difference and mild dizziness with lingering sx on 02/18/22 Goal status: REVISED  4. Pt will perform gait with scanning environment over level and unlevel surfaces with no dizziness  Baseline: Goal status: NEW  ASSESSMENT:  CLINICAL IMPRESSION: Today's skilled session focused on assessing pt's LTGs with pt meeting 3 out of 5 LTGs. Pt improved FOTO score, indicating improved functional outcomes in regards to pt's dizziness. Pt improved SOT with a composite score and all sensory analysis scores above normal limits (see  above for more details). Pt did not meet 2 out of 5 LTGs. Pt with very mild dizziness with head motions and had a 3 line difference and mild dizziness with DVA testing, indicating impaired VOR. Pt will continue to benefit from skilled PT in order to address dizziness, VOR deficits, head motions in order to improved functional mobility. LTGs updated/ongoing as appropriate.     OBJECTIVE IMPAIRMENTS decreased activity tolerance and decreased balance.   ACTIVITY LIMITATIONS  N/A  PARTICIPATION LIMITATIONS: shopping   PERSONAL FACTORS Time since onset of injury/illness/exacerbation and 1 comorbidity: hx of migraines  are also affecting patient's functional outcome.   REHAB POTENTIAL: Good  CLINICAL DECISION MAKING: Stable/uncomplicated  EVALUATION COMPLEXITY: Low   PLAN: PT FREQUENCY: 1x/week  PT DURATION: 4 weeks  PLANNED INTERVENTIONS: Therapeutic exercises, Therapeutic activity, Neuromuscular re-education, Balance training, Gait training, Patient/Family education, Self Care, Vestibular training, and Visual/preceptual remediation/compensation  PLAN FOR NEXT SESSION: Progressing VOR, try VOR tasks in SOT machine. Balance with head motions/head nods.   Arliss Journey, PT, DPT  02/18/2022, 8:56 AM

## 2022-02-19 ENCOUNTER — Other Ambulatory Visit: Payer: 59

## 2022-02-25 ENCOUNTER — Encounter: Payer: Self-pay | Admitting: Physical Therapy

## 2022-03-04 ENCOUNTER — Ambulatory Visit: Payer: 59 | Admitting: Physical Therapy

## 2022-03-04 ENCOUNTER — Encounter: Payer: Self-pay | Admitting: Physical Therapy

## 2022-03-04 DIAGNOSIS — R42 Dizziness and giddiness: Secondary | ICD-10-CM

## 2022-03-04 NOTE — Therapy (Addendum)
OUTPATIENT PHYSICAL THERAPY VESTIBULAR TREATMENT     Patient Name: Kathryn Bradley MRN: [id number] DOB:[birth year], 36 y.o., female Today's Date: 03/04/2022  PCP: Maude Leriche, PA-C REFERRING PROVIDER: Melvenia Beam, MD   PT End of Session - 03/04/22 0849     Visit Number 7    Number of Visits 10    Date for PT Re-Evaluation 03/20/22    Authorization Type [employer]et[employer]    PT Start Time 0848    PT Stop Time 0928    PT Time Calculation (min) 40 min    Activity Tolerance Patient tolerated treatment well    Behavior During Therapy [facility] for tasks assessed/performed             Past Medical History:  Diagnosis Date   Abdominal pain    Allergic rhinitis    Anxiety    Asthma    Depression    Esophageal reflux    Family history of breast cancer    Family history of colon cancer    Family history of ovarian cancer    Lactose intolerance    Migraine headache    Past Surgical History:  Procedure Laterality Date   WISDOM TOOTH EXTRACTION  2005   Patient Active Problem List   Diagnosis Date Noted   Other allergic rhinitis 09/23/2020   History of asthma 09/23/2020   Lactose intolerance 09/23/2020   Allergic conjunctivitis of both eyes 09/23/2020   Genetic testing 06/20/2020   Family history of breast cancer    Family history of ovarian cancer    Family history of colon cancer    Labor and delivery, indication for care 02/28/2018    ONSET DATE: 12/10/2021  REFERRING DIAG: R42 (ICD-10-CM) - Vertigo R42 (ICD-10-CM) - Dizziness    THERAPY DIAG:  Dizziness and giddiness  Rationale for Evaluation and Treatment Rehabilitation  SUBJECTIVE:   SUBJECTIVE STATEMENT:  Reports dizziness has bene fine. Has not been sleeping well the past few nights.   Pt accompanied by: self  PERTINENT HISTORY:  Past medical history dizziness/vertigo, reflux, abdominal pain, allergic rhinitis, lactose intolerance, depression, anxiety, asthma, migraines.  ENT testing was normal from visit  in May.   PAIN:  Are you having pain? No  PRECAUTIONS: None   PATIENT GOALS Figure out if there is something that triggers her dizziness or what to do to help it.     OBJECTIVE:   VESTIBULAR TREATMENT Gaze Adaptation:   x1 Viewing Horizontal: Position: Standing with feet together with busy background, Time: 60 seconds Comment: Felt swaying a little bit. Mild dizziness. Progressing to standing with feet apart on air ex and busy background x60 seconds. Mild dizziness on foam.   and x1 Viewing Vertical: Position: Standing with feet together with busy background, Time: 60 seconds Comment: Felt swaying a little bit. Mild dizziness. Progressing to standing with feet apart on air ex and busy background x60 seconds. Mild dizziness on foam.    On thick blue foam: -Feet together EO; diagonal head motions x10 reps each direction. Holding ball and making CW and CCW circles tracking with head and eyes x10 reps each direction. Mild dizziness, reported feeling better the second rep.  -With EC feet together x10 reps head turns, x10 reps head nods. Mild postural sway. -With EO, performing mini squat with feet apart and trunk rotations with ball in a diagonal pattern x10 reps each direction, progressing to feet together, mild dizziness. Working on gradually incr the speed with incr reps.  Forward gait over 30' with tracking  ball with head/eyes making CW and CCW circles, x2 reps of each. Mild dizziness.   Bouncing on trampoline for improved otolith function and gentle bouncing: EO with eyes on target x30 seconds, EO keeping eyes focused on X and head turns 2 sets of 10 reps, then repeated with head nods 2 sets of 10 reps. Mild dizziness. With EC and gentle bouncing 2 x 30 seconds, pt with tendency to bounce anteriorly and to the R  Standing on blue side of BOSU, EO 2 sets of 10 reps head turns, 2 sets of 10 reps head nods. Incr difficulty with head nods     PATIENT EDUCATION: Education details:  Continue with HEP and potential D/C at next session due to progress.  Person educated: Patient Education method: Explanation Education comprehension: verbalized understanding  HEP: Standing VOR x60 seconds with feet apart and busy background, [id number]   GOALS: Goals reviewed with patient? Yes  SHORT TERM GOALS: Target date: ALL STGS  = LTGS    UPDATED LTGS FOR RE-CERT LONG TERM GOALS: Target date: 03/20/22  Pt will be independent with final HEP for vestibular deficits in order to build upon functional gains made in therapy.  Baseline: pt currently independent with initial HEP, will benefit from updates/additions  Goal status: ON-GOING   2.  Pt will perform 10 reps head turns and 10 reps head nods in standing with no dizziness in order to demo improved motion sensitivity.  Baseline: 1/5 dizziness with 5 reps on 02/18/22 Goal status: NEW  3.  Pt will perform DVA with 2 line difference or less with mild to no dizziness in order to demo improved VOR Baseline: 2 line difference and mild dizziness/feeling "off"; 3 line difference and mild dizziness with lingering sx on 02/18/22 Goal status: REVISED  4. Pt will perform gait with scanning environment over level and unlevel surfaces with no dizziness  Baseline: Goal status: NEW  ASSESSMENT:  CLINICAL IMPRESSION: Today's skilled session focused on progressing VOR exercises and vestibular exercises with EC/head motions. Pt tolerated session well, with very minimal incr in dizziness with exercises. Pt challenged by head nods with balance and with visual tracking in a diagonal direction. Pt has made great progress with PT, with potential D/C at next session. Will continue to progress towards LTGs.    OBJECTIVE IMPAIRMENTS decreased activity tolerance and decreased balance.   ACTIVITY LIMITATIONS  N/A  PARTICIPATION LIMITATIONS: shopping   PERSONAL FACTORS Time since onset of injury/illness/exacerbation and 1 comorbidity: hx of  migraines  are also affecting patient's functional outcome.   REHAB POTENTIAL: Good  CLINICAL DECISION MAKING: Stable/uncomplicated  EVALUATION COMPLEXITY: Low   PLAN: PT FREQUENCY: 1x/week  PT DURATION: 4 weeks  PLANNED INTERVENTIONS: Therapeutic exercises, Therapeutic activity, Neuromuscular re-education, Balance training, Gait training, Patient/Family education, Self Care, Vestibular training, and Visual/preceptual remediation/compensation  PLAN FOR NEXT SESSION: Check goals, potential D/C? Progressing VOR, try VOR tasks in SOT machine. Balance with head motions/head nods.   Arliss Journey, PT, DPT  03/04/2022, 9:35 AM

## 2022-03-09 ENCOUNTER — Ambulatory Visit: Payer: 59 | Attending: Neurology | Admitting: Physical Therapy

## 2022-03-09 ENCOUNTER — Encounter: Payer: Self-pay | Admitting: Physical Therapy

## 2022-03-09 DIAGNOSIS — R42 Dizziness and giddiness: Secondary | ICD-10-CM | POA: Diagnosis present

## 2022-03-09 NOTE — Therapy (Signed)
OUTPATIENT PHYSICAL THERAPY VESTIBULAR TREATMENT/DISCHARGE SUMMARY     Patient Name: Kathryn Bradley MRN: [id number] DOB:1985-08-06, 36 y.o., female Today's Date: 03/09/2022  PCP: Maude Leriche, PA-C REFERRING PROVIDER: Melvenia Beam, MD   PT End of Session - 03/09/22 0805     Visit Number 8    Number of Visits 10    Date for PT Re-Evaluation 03/20/22    Authorization Type Aetna    PT Start Time 0804    PT Stop Time 0830   full time not used due to D/C visit   PT Time Calculation (min) 26 min    Activity Tolerance Patient tolerated treatment well    Behavior During Therapy [facility] for tasks assessed/performed             Past Medical History:  Diagnosis Date   Abdominal pain    Allergic rhinitis    Anxiety    Asthma    Depression    Esophageal reflux    Family history of breast cancer    Family history of colon cancer    Family history of ovarian cancer    Lactose intolerance    Migraine headache    Past Surgical History:  Procedure Laterality Date   WISDOM TOOTH EXTRACTION  2005   Patient Active Problem List   Diagnosis Date Noted   Other allergic rhinitis 09/23/2020   History of asthma 09/23/2020   Lactose intolerance 09/23/2020   Allergic conjunctivitis of both eyes 09/23/2020   Genetic testing 06/20/2020   Family history of breast cancer    Family history of ovarian cancer    Family history of colon cancer    Labor and delivery, indication for care 02/28/2018    ONSET DATE: 12/10/2021  REFERRING DIAG: R42 (ICD-10-CM) - Vertigo R42 (ICD-10-CM) - Dizziness    THERAPY DIAG:  Dizziness and giddiness  Rationale for Evaluation and Treatment Rehabilitation  SUBJECTIVE:   SUBJECTIVE STATEMENT:  Feels like she still gets dizzy sometimes when she goes sideways, like when she is going to look under the bed or the couch. Mainly just that now. Didn't get motion sick in the car when driving to and from [place].   Pt accompanied by: self  PERTINENT  HISTORY:  Past medical history dizziness/vertigo, reflux, abdominal pain, allergic rhinitis, lactose intolerance, depression, anxiety, asthma, migraines.  ENT testing was normal from visit in May.   PAIN:  Are you having pain? No  PRECAUTIONS: None   PATIENT GOALS Figure out if there is something that triggers her dizziness or what to do to help it.     OBJECTIVE:   VESTIBULAR ASSESSMENT Dynamic Visual Acuity: Static: Line 11 Dynamic: Line 10            Mild dizziness   Standing 10 reps for motion sensitivity:  Head turns: mild dizziness (1/10) Head nods: mild dizziness (1/10) Pt reporting very mild.   Gait with head motions over 115', pt reporting only very mild dizziness.   VESTIBULAR TREATMENT  Reviewed finalized HEP (see HEP section below) and [employer] for more details. Discussed that pt can perform balance on compliant surfaces with EO as well as EC. Performed forward gait with VOR x1 in the horizontal and vertical directions over 30-40' for pt to perform at home going forwards. Pt able to verbalize/demo understanding. Discussed continuing with exercises a few times a week.  Pt reporting only still having some dizziness when bending over sideways to look under things (like looking under the bed). Trialed Nestor Lewandowsky  exercises for quick sideways movement, pt reporting no dizziness with this. Instead discussed performing looking under the bed as a habituation activity at home     PATIENT EDUCATION: Education details: See above, final HEP, progress towards goals, D/C from therapy at this time. Discussed if sx come back in the future, pt can get a new referral to return.  Person educated: Patient Education method: Explanation Education comprehension: verbalized understanding  HEP: Standing VOR x60 seconds with feet together on foam and busy background, [id number] Forward gait with VOR x1    PHYSICAL THERAPY DISCHARGE SUMMARY  Visits from Start of Care: 8  Current  functional level related to goals / functional outcomes: See LTGs.    Remaining deficits: Very mild dizziness.   Education / Equipment: HEP    Patient agrees to discharge. Patient goals were partially met. Patient is being discharged due to being pleased with the current functional level. And pt's progress/meeting the goals.    GOALS: Goals reviewed with patient? Yes  SHORT TERM GOALS: Target date: ALL STGS  = LTGS    UPDATED LTGS FOR RE-CERT LONG TERM GOALS: Target date: 03/20/22  Pt will be independent with final HEP for vestibular deficits in order to build upon functional gains made in therapy.  Baseline: pt currently independent with initial HEP, will benefit from updates/additions  Goal status: MET  2.  Pt will perform 10 reps head turns and 10 reps head nods in standing with no dizziness in order to demo improved motion sensitivity.  Baseline: 1/10 very mild dizziness with 10 reps, pt overall reporting improvements in head motions  Goal status: PARTIALLY MET   3.  Pt will perform DVA with 2 line difference or less with mild to no dizziness in order to demo improved VOR Baseline: 2 line difference and mild dizziness/feeling "off"; 3 line difference and mild dizziness with lingering sx on 02/18/22  1 line difference with mild dizziness  Goal status: MET   4. Pt will perform gait with scanning environment over level and unlevel surfaces with no dizziness  Baseline: pt with only with a very little bit of dizziness  Goal status: PARTIALLY MET   ASSESSMENT:  CLINICAL IMPRESSION: Today's skilled session focused on assessing pt's LTGs for anticipated D/C. Pt has met/partially met all LTGs. Pt now with a 1 line difference with DVA and mild dizziness, indicating improved VOR. Previously was a 3 line difference and mild/mod dizziness. Pt with very slight dizziness after gait with head motions, but pt reports that this is much better than it has been in the past. Due to progress  with goals and pt being pleased with her progress/improvements, will D/C from therapy at this time. Pt to continue to work on HEP for VOR and balance with focus on vestibular system.    OBJECTIVE IMPAIRMENTS decreased activity tolerance and decreased balance.   ACTIVITY LIMITATIONS  N/A  PARTICIPATION LIMITATIONS: shopping   PERSONAL FACTORS Time since onset of injury/illness/exacerbation and 1 comorbidity: hx of migraines  are also affecting patient's functional outcome.   REHAB POTENTIAL: Good  CLINICAL DECISION MAKING: Stable/uncomplicated  EVALUATION COMPLEXITY: Low   PLAN: PT FREQUENCY: 1x/week  PT DURATION: 4 weeks  PLANNED INTERVENTIONS: Therapeutic exercises, Therapeutic activity, Neuromuscular re-education, Balance training, Gait training, Patient/Family education, Self Care, Vestibular training, and Visual/preceptual remediation/compensation  PLAN FOR NEXT SESSION: D/C from therapy.   Arliss Journey, PT, DPT  03/09/2022, 9:40 AM

## 2022-05-04 ENCOUNTER — Telehealth: Payer: 59 | Admitting: Neurology

## 2022-05-07 ENCOUNTER — Other Ambulatory Visit: Payer: Self-pay | Admitting: Nurse Practitioner

## 2022-05-07 ENCOUNTER — Other Ambulatory Visit (HOSPITAL_COMMUNITY)
Admission: RE | Admit: 2022-05-07 | Discharge: 2022-05-07 | Disposition: A | Discharge status: Home / Self Care | Payer: 59 | Source: Ambulatory Visit | Attending: Nurse Practitioner | Admitting: Nurse Practitioner

## 2022-05-07 DIAGNOSIS — Z124 Encounter for screening for malignant neoplasm of cervix: Secondary | ICD-10-CM | POA: Diagnosis present

## 2022-05-14 LAB — CYTOLOGY - PAP
Comment: NEGATIVE
Diagnosis: NEGATIVE
High risk HPV: NEGATIVE

## 2022-05-22 ENCOUNTER — Ambulatory Visit
Admission: RE | Admit: 2022-05-22 | Discharge: 2022-05-22 | Disposition: A | Discharge status: Home / Self Care | Payer: 59 | Source: Ambulatory Visit | Attending: Hematology and Oncology | Admitting: Hematology and Oncology

## 2022-05-22 ENCOUNTER — Other Ambulatory Visit: Payer: 59

## 2022-05-22 DIAGNOSIS — N6310 Unspecified lump in the right breast, unspecified quadrant: Secondary | ICD-10-CM

## 2022-05-22 DIAGNOSIS — N6489 Other specified disorders of breast: Secondary | ICD-10-CM

## 2022-05-25 ENCOUNTER — Other Ambulatory Visit: Payer: Self-pay | Admitting: Hematology and Oncology

## 2022-05-25 DIAGNOSIS — N6310 Unspecified lump in the right breast, unspecified quadrant: Secondary | ICD-10-CM

## 2022-05-26 ENCOUNTER — Other Ambulatory Visit: Payer: Self-pay | Admitting: Hematology and Oncology

## 2022-05-26 DIAGNOSIS — Z9189 Other specified personal risk factors, not elsewhere classified: Secondary | ICD-10-CM

## 2022-09-07 ENCOUNTER — Encounter: Payer: Self-pay | Admitting: Hematology and Oncology

## 2022-11-23 ENCOUNTER — Ambulatory Visit
Admission: RE | Admit: 2022-11-23 | Discharge: 2022-11-23 | Disposition: A | Discharge status: Home / Self Care | Payer: 59 | Source: Ambulatory Visit | Attending: Hematology and Oncology | Admitting: Hematology and Oncology

## 2022-11-23 DIAGNOSIS — N6310 Unspecified lump in the right breast, unspecified quadrant: Secondary | ICD-10-CM

## 2022-11-25 ENCOUNTER — Telehealth: Payer: Self-pay | Admitting: Hematology and Oncology

## 2022-11-25 NOTE — Telephone Encounter (Signed)
Spoke with patient confirming upcoming appointment  

## 2022-11-30 ENCOUNTER — Other Ambulatory Visit: Payer: Self-pay

## 2022-11-30 ENCOUNTER — Inpatient Hospital Stay: Payer: 59 | Attending: Hematology and Oncology | Admitting: Hematology and Oncology

## 2022-11-30 VITALS — BP 112/65 | HR 88 | Temp 98.5°F | Resp 16 | Wt 127.7 lb

## 2022-11-30 DIAGNOSIS — Z800 Family history of malignant neoplasm of digestive organs: Secondary | ICD-10-CM | POA: Diagnosis not present

## 2022-11-30 DIAGNOSIS — Z808 Family history of malignant neoplasm of other organs or systems: Secondary | ICD-10-CM | POA: Diagnosis not present

## 2022-11-30 DIAGNOSIS — Z1501 Genetic susceptibility to malignant neoplasm of breast: Secondary | ICD-10-CM | POA: Diagnosis present

## 2022-11-30 DIAGNOSIS — Z8052 Family history of malignant neoplasm of bladder: Secondary | ICD-10-CM | POA: Insufficient documentation

## 2022-11-30 DIAGNOSIS — Z8051 Family history of malignant neoplasm of kidney: Secondary | ICD-10-CM | POA: Diagnosis not present

## 2022-11-30 DIAGNOSIS — Z803 Family history of malignant neoplasm of breast: Secondary | ICD-10-CM | POA: Insufficient documentation

## 2022-11-30 DIAGNOSIS — Z8041 Family history of malignant neoplasm of ovary: Secondary | ICD-10-CM | POA: Diagnosis not present

## 2022-11-30 DIAGNOSIS — Z9189 Other specified personal risk factors, not elsewhere classified: Secondary | ICD-10-CM | POA: Diagnosis not present

## 2022-11-30 NOTE — Progress Notes (Signed)
[facility] CONSULT NOTE  Patient Care Team: Scifres, Nicole Cella, PA-C (Inactive) as PCP - General (Physician Assistant)  CHIEF COMPLAINTS/PURPOSE OF CONSULTATION:  High risk breast cancer clinic.  ASSESSMENT & PLAN:   1.  Family history of breast cancer in mother and maternal cousin Her mom had breast cancer in her 49s and her maternal first cousin had breast cancer at the age of 17.  She continues on mammogram alternating with MRI.  Her most recent mammogram and [place] neg for malignancy. Her life time risk of BC is greater than 20%, so I am not entirely sure why her MRI breast is not authorized. She has a first cousin who was diagnosed with BC at the age of 32, and her lifetime risk is greater than 20% hence have reordered the MRI for intensified cancer screening.  No concerns on breast exam today.  Last time when we ordered it we had a tough time with insurance clearing the need for MRI.  Will reattempt authorization. She will continue self breast exam monthly, return to clinic in 12 months or sooner as needed.    HISTORY OF PRESENTING ILLNESS:   Raja Caputi 37 y.o. female is here because of Life time risk of BC per TC model to be about 25%  This is a very pleasant 37 year old female patient who is very healthy referred to our high-risk breast cancer clinic given her family history of breast cancer. She has significant family history of breast cancer in her mom who had bilateral breast cancer, breast cancer at the age of 36 in the setting of breast cancer at the age of 102 which was triple negative and she died from breast cancer at the age of 81.  Ms. Routzahn also has a maternal cousin who had breast cancer in her mid 97s.  Maternal grandmother had ovarian cancer.  Ms./personally has never had any breast biopsies or any known breast abnormalities.  She is healthy at baseline, has never had a mammogram but does self breast exam and denies any breast findings.  She denies any birth  control usage.    Age of menarche: 55 Age at first pregnancy: 37 No birth control  Interval history  Ms. Seneca is here for follow-up by herself.  Since her last visit she had a mammogram and ultrasound, nipple discharge on the right side has resolved.  Mammogram and ultrasound were unremarkable, she is quite relieved by this.  She is otherwise doing quite well.  No new complaints.  No breast changes reported.  No new palpable masses.  Rest of the pertinent 10 point ROS reviewed and negative  MEDICAL HISTORY:  Past Medical History:  Diagnosis Date   Abdominal pain    Allergic rhinitis    Anxiety    Asthma    Depression    Esophageal reflux    Family history of breast cancer    Family history of colon cancer    Family history of ovarian cancer    Lactose intolerance    Migraine headache     SURGICAL HISTORY: Past Surgical History:  Procedure Laterality Date   WISDOM TOOTH EXTRACTION  2005    SOCIAL HISTORY: Social History   Socioeconomic History   Marital status: Married    Spouse name: Not on file   Number of children: Not on file   Years of education: Not on file   Highest education level: Associate degree: academic program  Occupational History   Not on file  Tobacco Use  Smoking status: Never Smoker   Smokeless tobacco: Never Used  Vaping Use   Vaping Use: Never used  Substance and Sexual Activity   Alcohol use: No   Drug use: No   Sexual activity: Yes  Other Topics Concern   Not on file  Social History Narrative      Lives at home with her husband   Right handed   No caffeine   Social Determinants of Corporate investment banker Strain: Not on file  Food Insecurity: Not on file  Transportation Needs: Not on file  Physical Activity: Not on file  Stress: Not on file  Social Connections: Not on file  Intimate Partner Violence: Not on file    FAMILY HISTORY: Family History  Problem Relation Age of Onset   Breast cancer Mother 68       triple  negative   Heart attack Mother    Heart disease Mother    Diabetes Mother    Heart Problems Father 53   Heart disease Father    Diabetes Maternal Grandmother    Ovarian cancer Maternal Grandmother    Macular degeneration Maternal Grandmother    Colon cancer Maternal Grandmother    Kidney cancer Maternal Grandfather    Bladder Cancer Maternal Grandfather    Skin cancer Maternal Grandfather    Diabetes Maternal Grandfather    Breast cancer Cousin        dx late 48s, mat first cousin   Macular degeneration Maternal Aunt     ALLERGIES:  is allergic to lactose intolerance (gi).  MEDICATIONS:  Current Outpatient Medications  Medication Sig Dispense Refill   [employer] SUNSCREEN SPF 30 EX apply     Fexofenadine HCl (ALLEGRA PO) Take 1 tablet by mouth daily.     methylPREDNISolone (MEDROL DOSEPAK) 4 MG TBPK tablet Take pills daily all together with food. Take the first dose (6 pills) as soon as possible. Take the rest each morning. For 6 days total 6-5-4-3-2-1. 21 tablet 1   ondansetron (ZOFRAN-ODT) 4 MG disintegrating tablet Take 1-2 tablets (4-8 mg total) by mouth every 8 (eight) hours as needed. 180 tablet 3   rizatriptan (MAXALT-MLT) 10 MG disintegrating tablet Take 1 tablet (10 mg total) by mouth as needed for migraine. May repeat in 2 hours if needed 9 tablet 11   topiramate (TOPAMAX) 50 MG tablet Take 1 tablet (50 mg total) by mouth at bedtime. 30 tablet 3   No current facility-administered medications for this visit.     PHYSICAL EXAMINATION:  ECOG PERFORMANCE STATUS: 0 - Asymptomatic  Vitals:   11/30/22 1150  BP: 112/65  Pulse: 88  Resp: 16  Temp: 98.5 F (36.9 C)  SpO2: 98%     Filed Weights   11/30/22 1150  Weight: 127 lb 11.2 oz (57.9 kg)    GENERAL:alert, no distress and comfortable Neck: No palpable cervical adenopathy Breast exam: Bilateral breasts inspected and palpated.  No palpable masses or regional adenopathy.  I could not elicit nipple discharge today.   No retroareolar masses.    LABORATORY DATA:  I have reviewed the data as listed Lab Results  Component Value Date   WBC 13.3 (H) 03/01/2018   HGB 10.0 (L) 03/01/2018   HCT 29.4 (L) 03/01/2018   MCV 91.3 03/01/2018   PLT 174 03/01/2018     Chemistry   No results found for: "NA", "K", "CL", "CO2", "BUN", "CREATININE", "GLU" No results found for: "CALCIUM", "ALKPHOS", "AST", "ALT", "BILITOT"    Lifetime risk  of breast cancer per [employer] model based on personal history and family history is about 27%.   RADIOGRAPHIC STUDIES: I have personally reviewed the radiological images as listed and agreed with the findings in the report. MM DIAG BREAST TOMO BILATERAL  Result Date: 11/23/2022 CLINICAL DATA:  Patient for short-term follow-up right breast asymmetries and right breast mass. EXAM: DIGITAL DIAGNOSTIC BILATERAL MAMMOGRAM WITH TOMOSYNTHESIS; ULTRASOUND RIGHT BREAST LIMITED TECHNIQUE: Bilateral digital diagnostic mammography and breast tomosynthesis was performed.; Targeted ultrasound examination of the right breast was performed COMPARISON:  Previous exam(s). ACR Breast Density Category c: The breasts are heterogeneously dense, which may obscure small masses. FINDINGS: Previously questioned asymmetries within the central medial right breast and upper posterior right breast are similar to prior, favored to represent dense tissue. No additional abnormalities identified within the right breast. Stable appearance left breast. Targeted ultrasound is performed, showing a stable 5 x 5 x 7 mm oval hypoechoic mass right breast 2 o'clock position 3 cm from the nipple. IMPRESSION: Stable probably benign right breast mass 2 o'clock position 3 cm from the nipple. Stable probably benign right breast asymmetries. RECOMMENDATION: Bilateral diagnostic mammography and right breast ultrasound in 12 months to ensure stability of probably benign right breast mass and asymmetries. I have discussed the findings and  recommendations with the patient. If applicable, a reminder letter will be sent to the patient regarding the next appointment. BI-RADS CATEGORY  3: Probably benign. Electronically Signed   By: Annia Belt M.D.   On: 11/23/2022 09:19  US BREAST LTD UNI RIGHT INC AXILLA  Result Date: 11/23/2022 CLINICAL DATA:  Patient for short-term follow-up right breast asymmetries and right breast mass. EXAM: DIGITAL DIAGNOSTIC BILATERAL MAMMOGRAM WITH TOMOSYNTHESIS; ULTRASOUND RIGHT BREAST LIMITED TECHNIQUE: Bilateral digital diagnostic mammography and breast tomosynthesis was performed.; Targeted ultrasound examination of the right breast was performed COMPARISON:  Previous exam(s). ACR Breast Density Category c: The breasts are heterogeneously dense, which may obscure small masses. FINDINGS: Previously questioned asymmetries within the central medial right breast and upper posterior right breast are similar to prior, favored to represent dense tissue. No additional abnormalities identified within the right breast. Stable appearance left breast. Targeted ultrasound is performed, showing a stable 5 x 5 x 7 mm oval hypoechoic mass right breast 2 o'clock position 3 cm from the nipple. IMPRESSION: Stable probably benign right breast mass 2 o'clock position 3 cm from the nipple. Stable probably benign right breast asymmetries. RECOMMENDATION: Bilateral diagnostic mammography and right breast ultrasound in 12 months to ensure stability of probably benign right breast mass and asymmetries. I have discussed the findings and recommendations with the patient. If applicable, a reminder letter will be sent to the patient regarding the next appointment. BI-RADS CATEGORY  3: Probably benign. Electronically Signed   By: Annia Belt M.D.   On: 11/23/2022 09:19   All questions were answered. The patient knows to call the clinic with any problems, questions or concerns. I spent in the care of this patient including H and P, review  of records, counseling and coordination of care.     Rachel Moulds, MD 11/30/2022 12:06 PM

## 2023-05-24 ENCOUNTER — Ambulatory Visit (HOSPITAL_COMMUNITY): Admission: RE | Admit: 2023-05-24 | Payer: 59 | Source: Ambulatory Visit

## 2023-05-28 ENCOUNTER — Encounter (HOSPITAL_COMMUNITY): Payer: Self-pay

## 2023-05-28 ENCOUNTER — Ambulatory Visit (HOSPITAL_COMMUNITY): Payer: 59

## 2023-08-04 ENCOUNTER — Other Ambulatory Visit: Payer: Self-pay | Admitting: *Deleted

## 2023-08-04 ENCOUNTER — Encounter: Payer: Self-pay | Admitting: Hematology and Oncology

## 2023-08-04 DIAGNOSIS — Z9189 Other specified personal risk factors, not elsewhere classified: Secondary | ICD-10-CM

## 2023-08-26 ENCOUNTER — Encounter (HOSPITAL_BASED_OUTPATIENT_CLINIC_OR_DEPARTMENT_OTHER): Payer: Self-pay | Admitting: Family Medicine

## 2023-11-16 IMAGING — MG MM DIGITAL DIAGNOSTIC UNILAT*R* W/ TOMO W/ CAD
8 series · 8 of 24 positions shown · non-contrast
Comparison: Screening exam, 11/10/2021.

CLINICAL DATA: Recall from screening for possible right breast
asymmetry and mass. Screening mammogram was the patient's baseline.
She is 35. Mother was diagnosed with breast carcinoma at age 56 and
a cousin diagnosed her late 40s.

EXAM:
DIGITAL DIAGNOSTIC UNILATERAL RIGHT MAMMOGRAM WITH TOMOSYNTHESIS AND
CAD; ULTRASOUND RIGHT BREAST LIMITED
TECHNIQUE: Right digital diagnostic mammography and breast tomosynthesis was
performed. The images were evaluated with computer-aided detection.;
Targeted ultrasound examination of the right breast was performed

[R MLO synth-2D (1 of 2)]
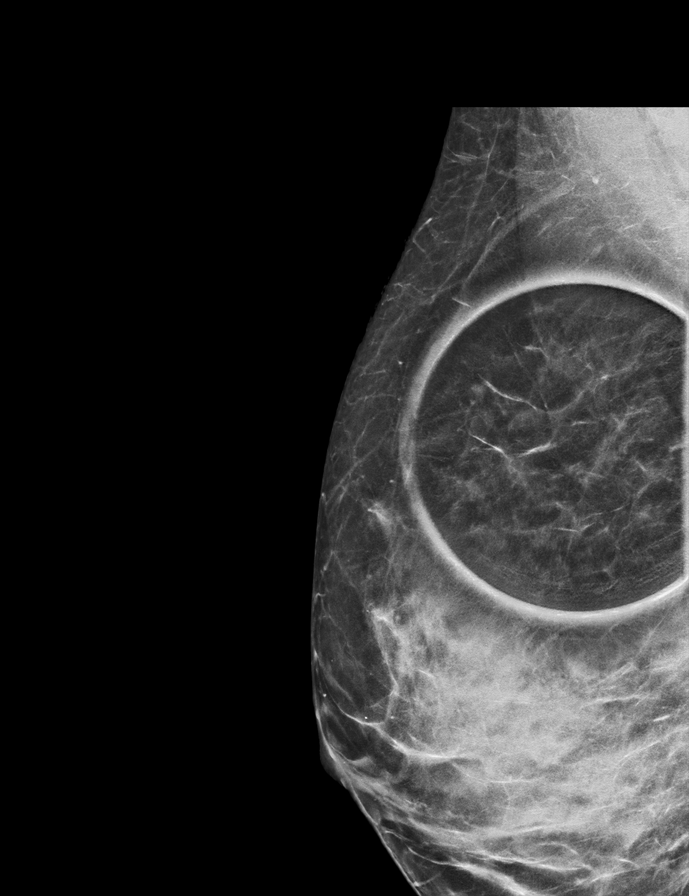

[R CC synth-2D]
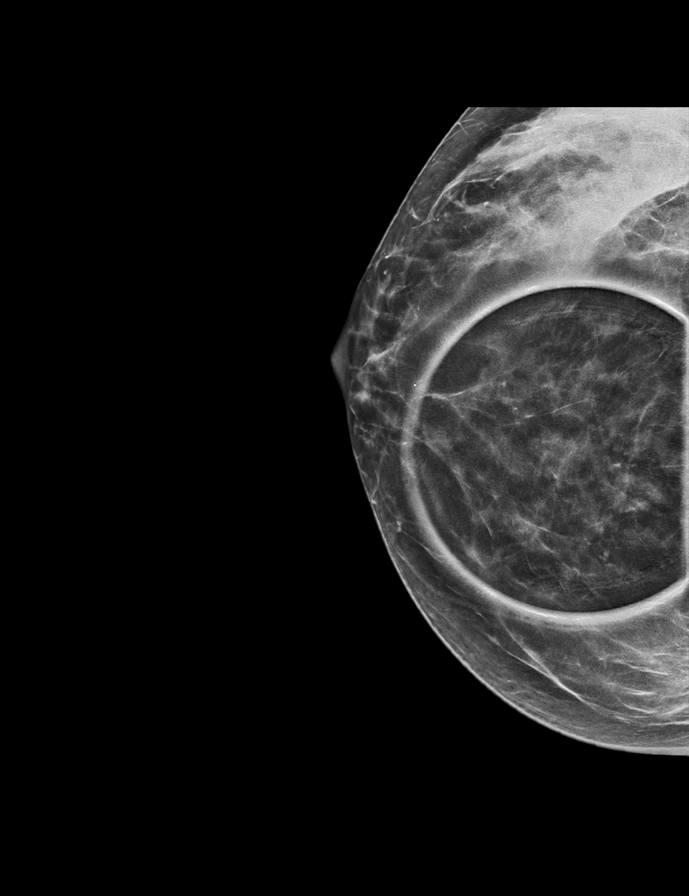

[R MLO synth-2D (2 of 2)]
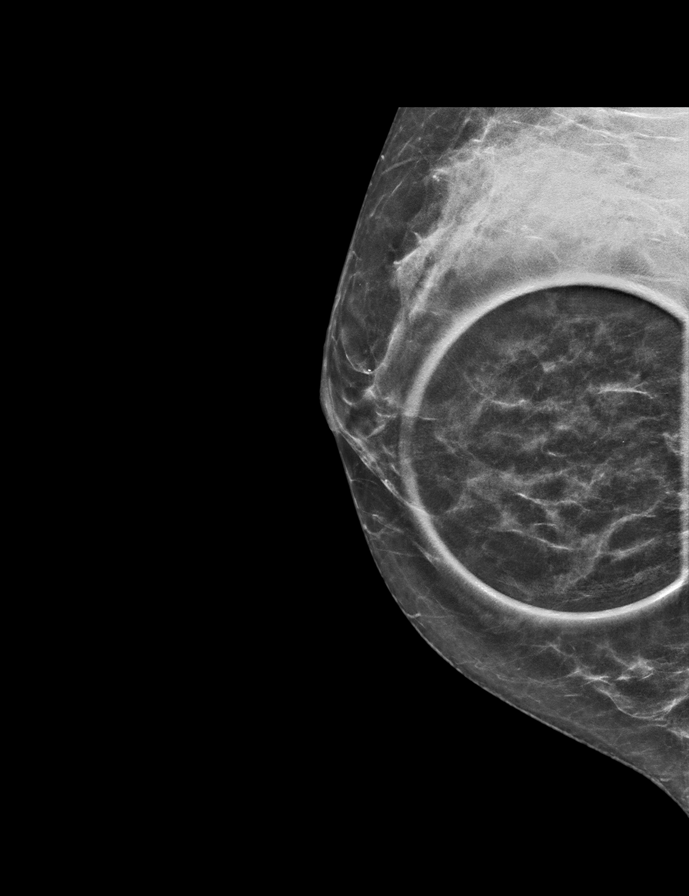

[R ML synth-2D]
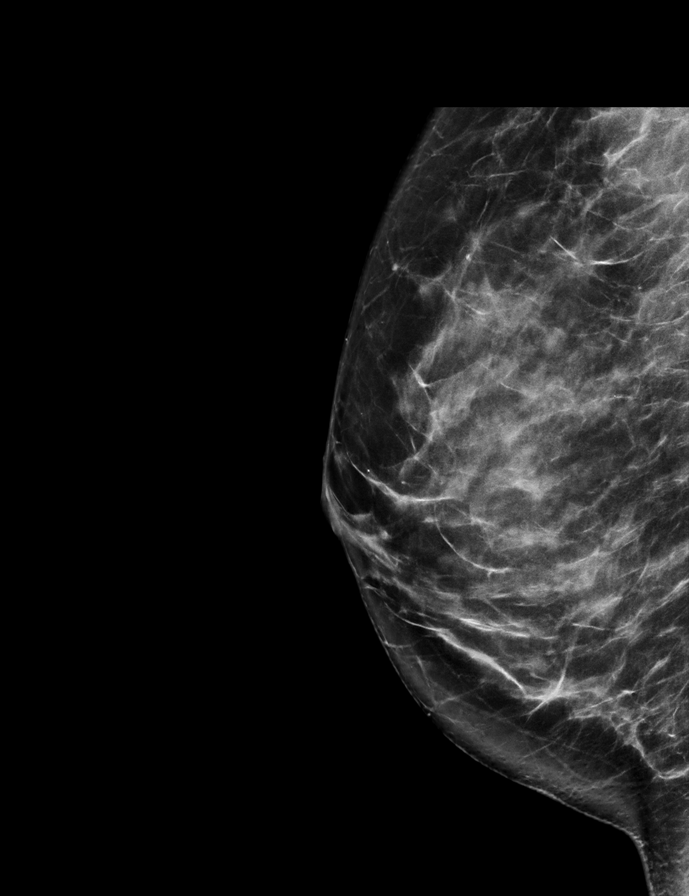

[R ML tomo · tomo slice 39/76.0]
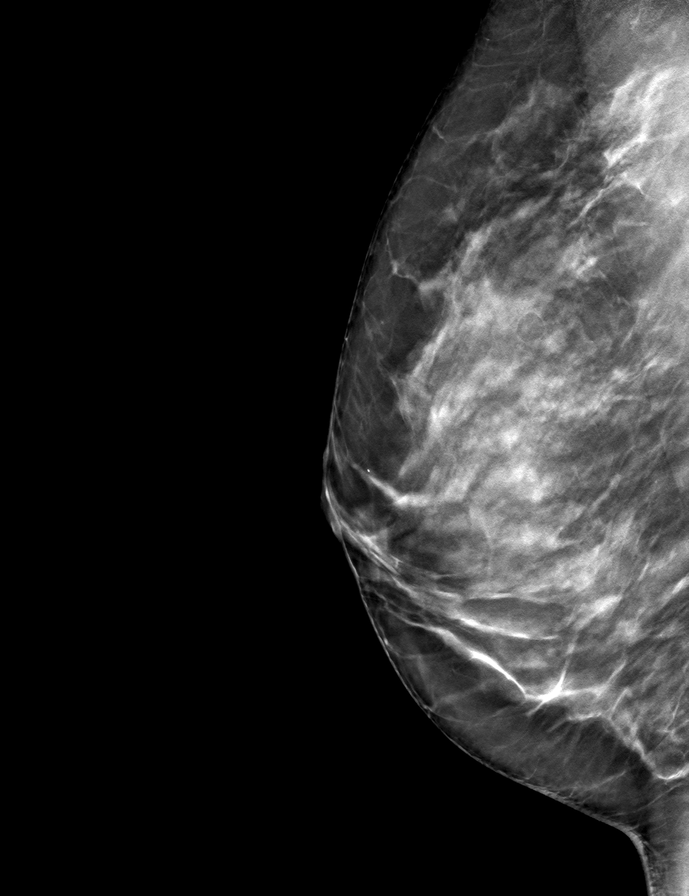

[R CC tomo · tomo slice 27/52.0]
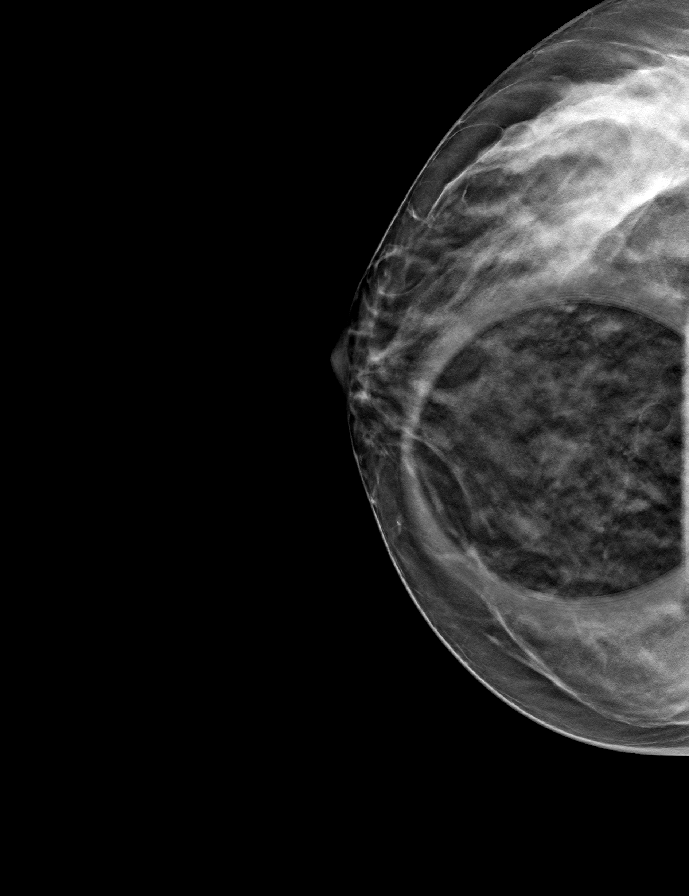

[R MLO tomo (1 of 2) · tomo slice 29/56.0]
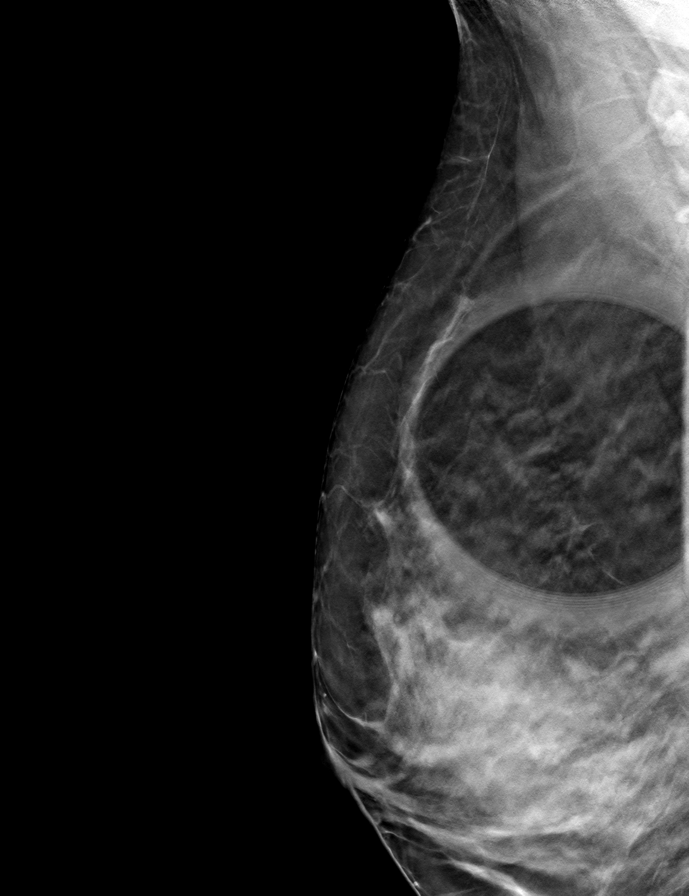

[R MLO tomo (2 of 2) · tomo slice 27/54.0]
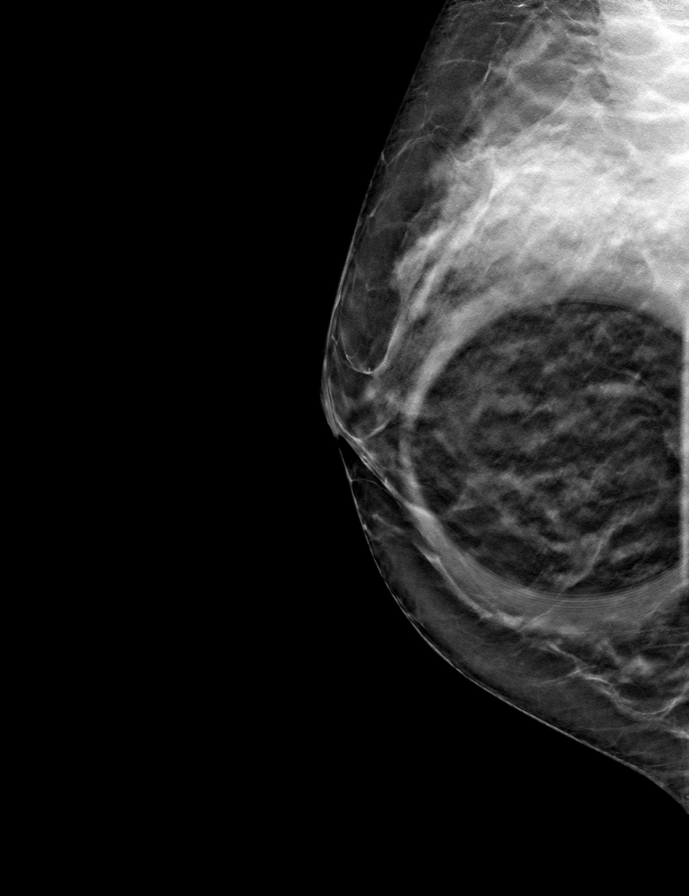

[8 of 24 positions shown; findings below may reference images not displayed]

ACR Breast Density Category c: The breast tissue is heterogeneously
dense, which may obscure small masses.
FINDINGS: The possible asymmetry noted in the upper outer right breast,
axillary tail region, disperses consistent with normal
fibroglandular tissue. There no underlying mass and no architectural
distortion.

The possible mass noted in the more central, medial right breast
partly disperses on spot compression imaging. There is a subtle
residual asymmetry, but no defined mass. No architectural
distortion.

Targeted ultrasound is performed, showing a hypoechoic oval mass at
2 o'clock, 3 cm the nipple, posterior depth, measuring 6 x 5 x 6 mm.
It shows relative increased echogenicity centrally suggesting
internal fat and may reflect an intramammary lymph node. No other
abnormality is seen in the medial right breast. No abnormality is
seen in the axillary tail region of the right breast.
IMPRESSION: 1. Probably benign asymmetry in the upper outer right breast,
consistent with normal fibroglandular tissue.
2. Probably benign asymmetry in the central medial right breast.
3. Probably benign small sonographic mass right breast at 2 o'clock,
3 cm the nipple, posterior depth.

RECOMMENDATION:
1. Short-term follow-up with diagnostic right breast mammography and
ultrasound in 6 months.

I have discussed the findings and recommendations with the patient.
If applicable, a reminder letter will be sent to the patient
regarding the next appointment.

BI-RADS CATEGORY  3: Probably benign.

## 2023-11-16 IMAGING — US US BREAST*R* LIMITED INC AXILLA
1 series · 9 of 9 positions shown · non-contrast
Comparison: Screening exam, 11/10/2021.

CLINICAL DATA: Recall from screening for possible right breast
asymmetry and mass. Screening mammogram was the patient's baseline.
She is 35. Mother was diagnosed with breast carcinoma at age 56 and
a cousin diagnosed her late 40s.

EXAM:
DIGITAL DIAGNOSTIC UNILATERAL RIGHT MAMMOGRAM WITH TOMOSYNTHESIS AND
CAD; ULTRASOUND RIGHT BREAST LIMITED
TECHNIQUE: Right digital diagnostic mammography and breast tomosynthesis was
performed. The images were evaluated with computer-aided detection.;
Targeted ultrasound examination of the right breast was performed

[Series 1: us breast*right* limited inc axilla · 0.06mm/px · 9 of 9 slices shown]
[im 1/9]
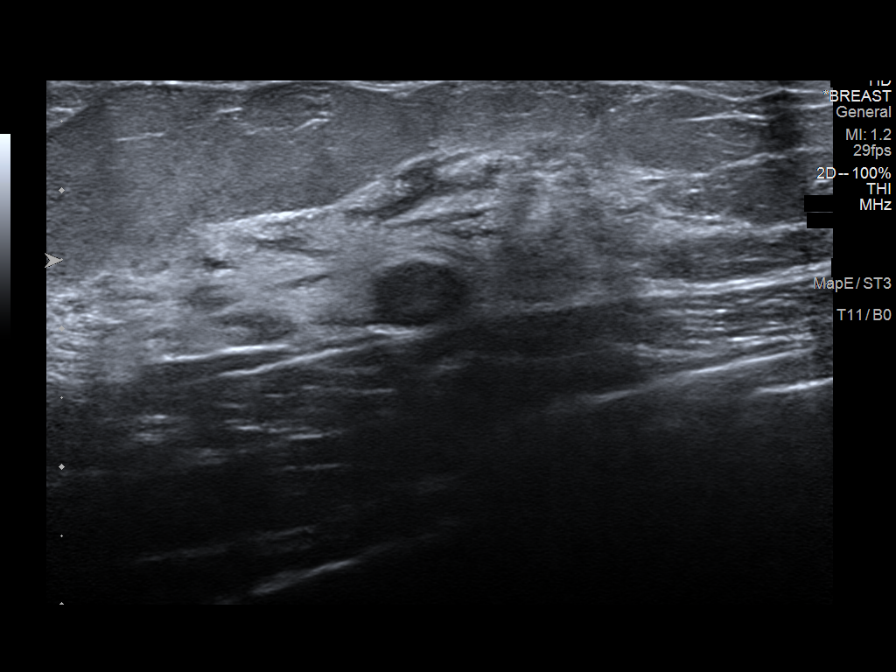
[im 2/9]
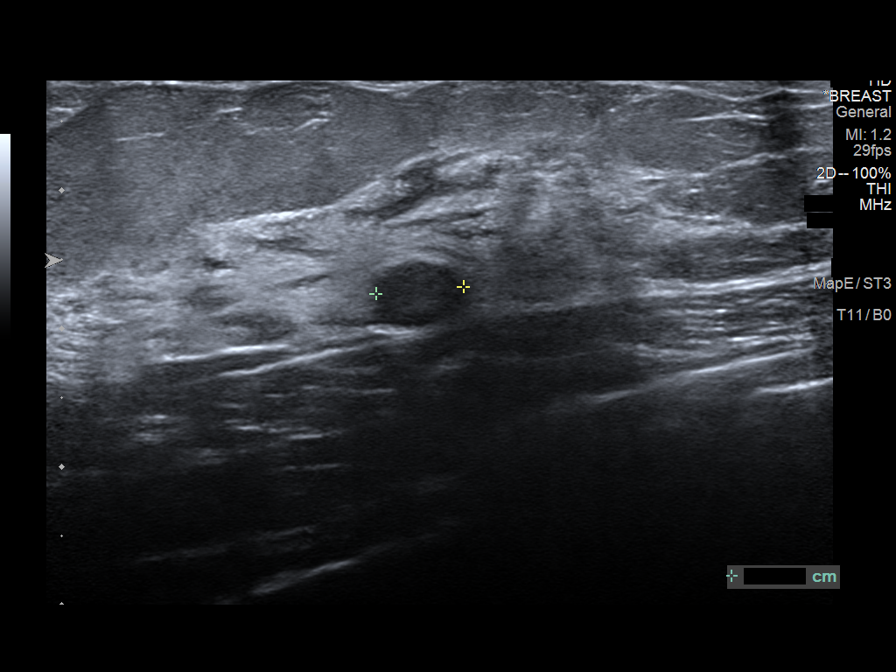
[im 3/9]
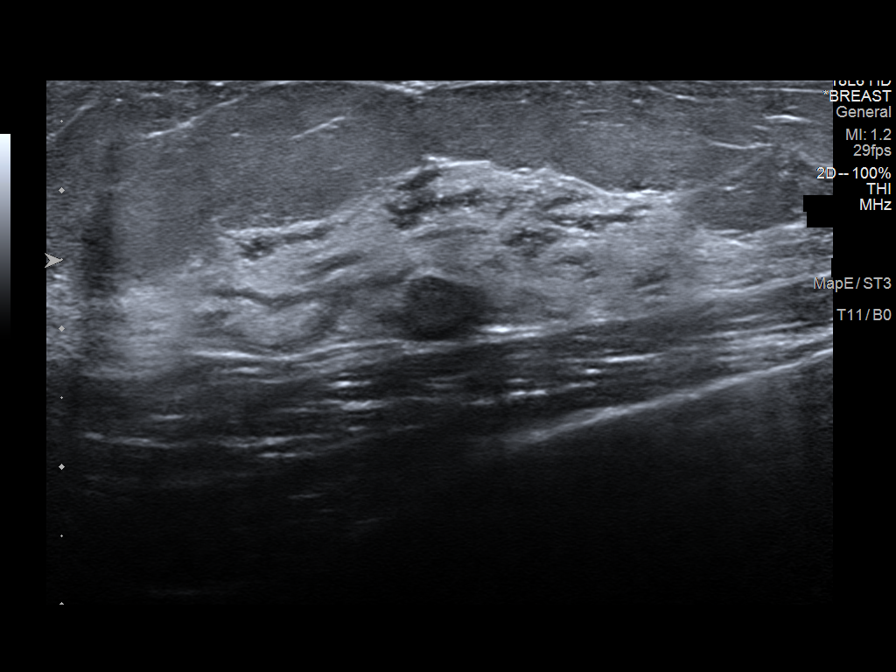
[im 4/9]
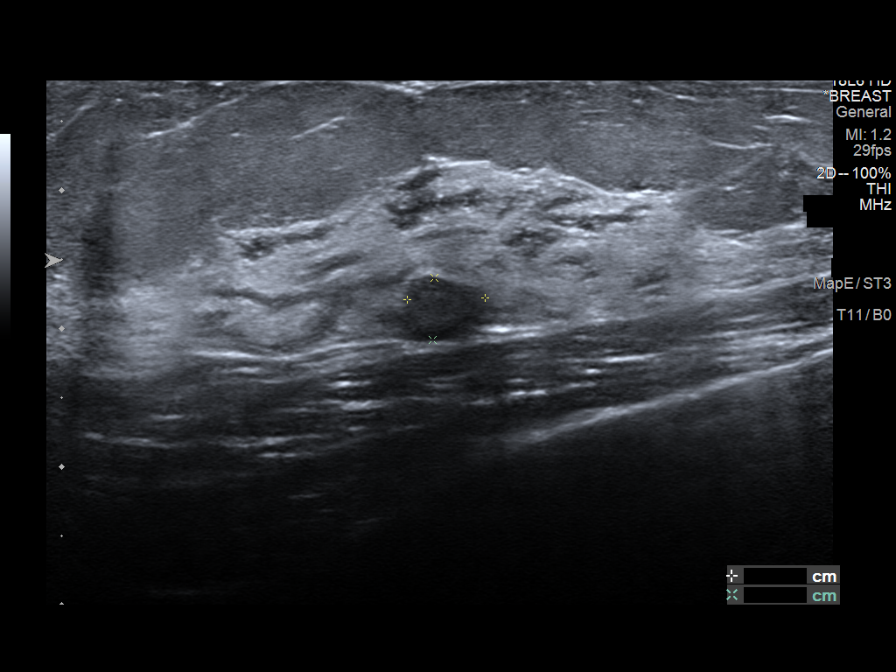
[im 5/9]
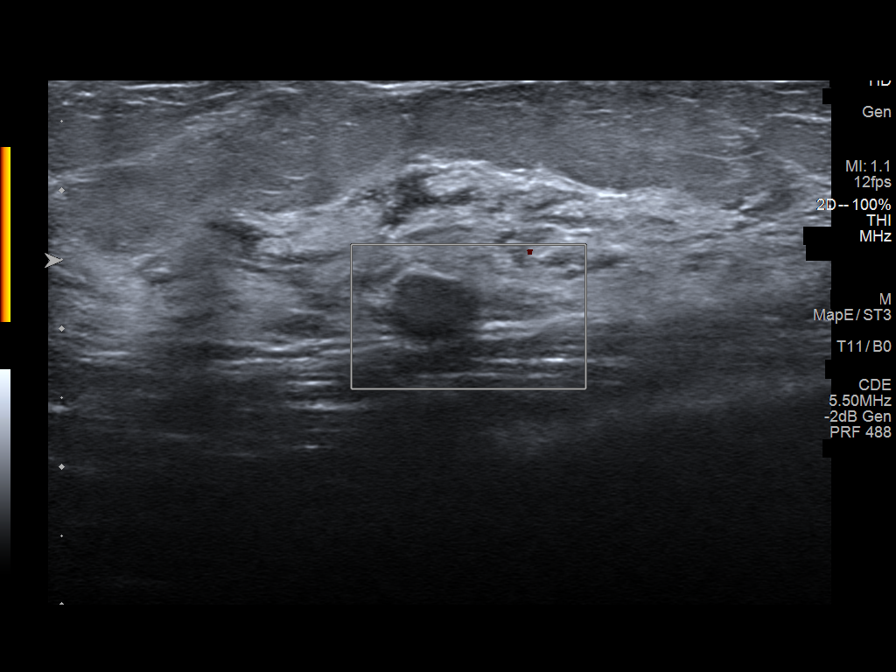
[im 6/9]
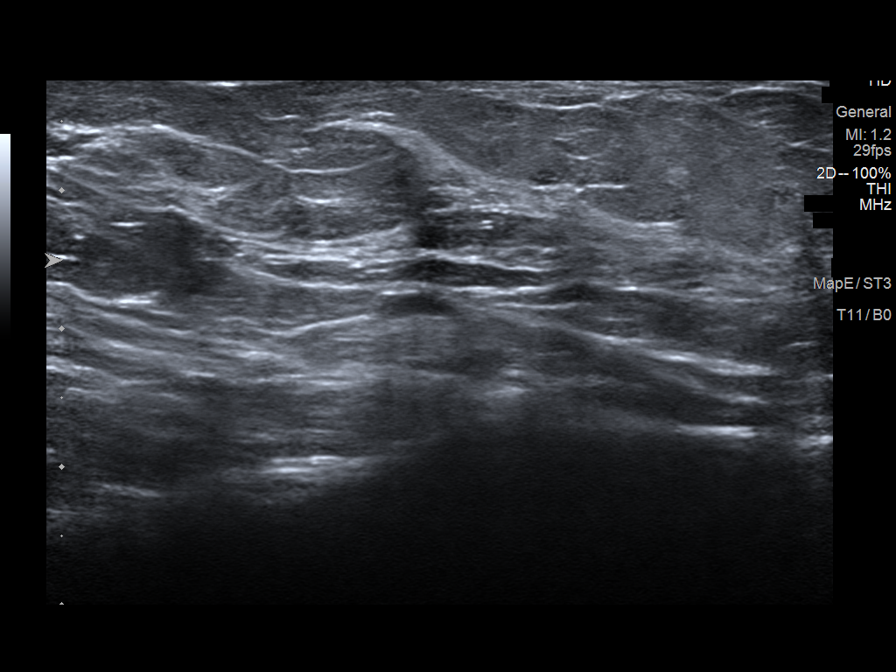
[im 7/9]
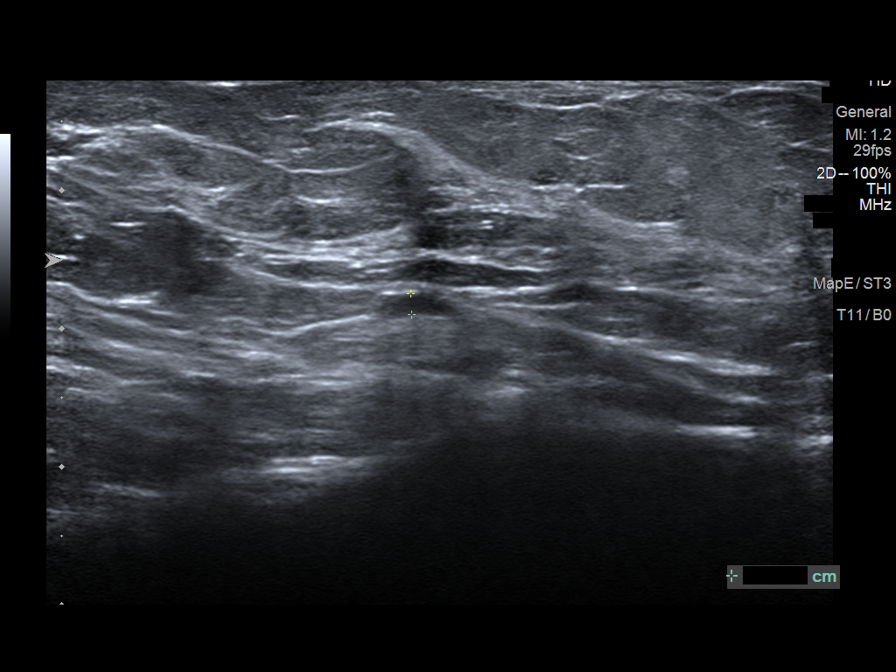
[im 8/9]
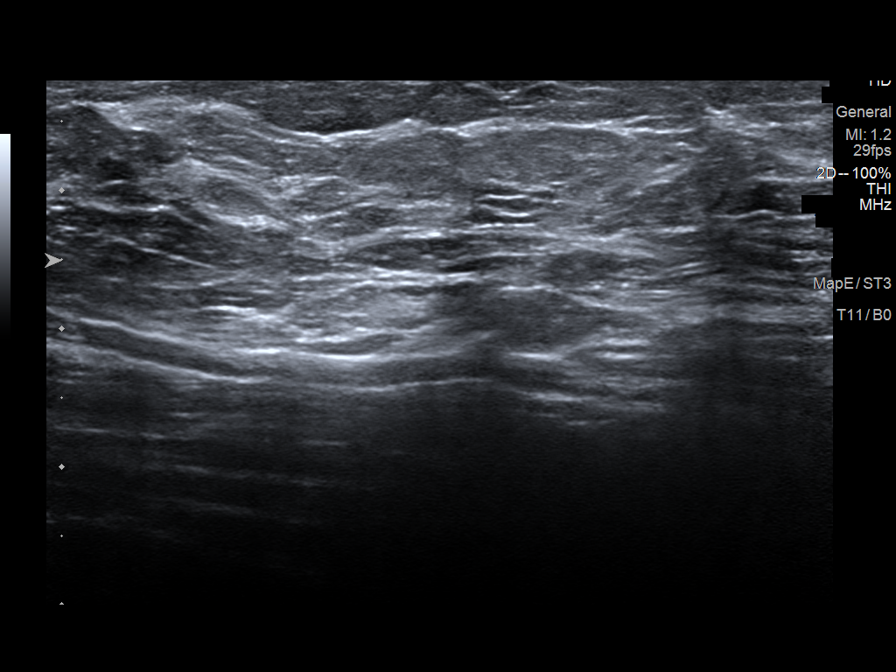
[im 9/9]
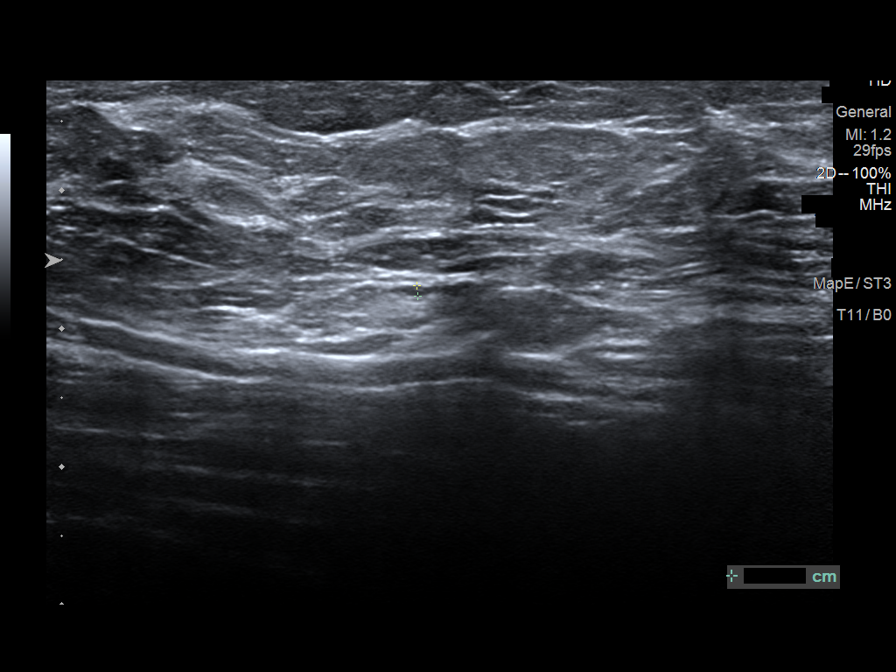

[9 of 9 positions shown; findings below may reference images not displayed]

ACR Breast Density Category c: The breast tissue is heterogeneously
dense, which may obscure small masses.
FINDINGS: The possible asymmetry noted in the upper outer right breast,
axillary tail region, disperses consistent with normal
fibroglandular tissue. There no underlying mass and no architectural
distortion.

The possible mass noted in the more central, medial right breast
partly disperses on spot compression imaging. There is a subtle
residual asymmetry, but no defined mass. No architectural
distortion.

Targeted ultrasound is performed, showing a hypoechoic oval mass at
2 o'clock, 3 cm the nipple, posterior depth, measuring 6 x 5 x 6 mm.
It shows relative increased echogenicity centrally suggesting
internal fat and may reflect an intramammary lymph node. No other
abnormality is seen in the medial right breast. No abnormality is
seen in the axillary tail region of the right breast.
IMPRESSION: 1. Probably benign asymmetry in the upper outer right breast,
consistent with normal fibroglandular tissue.
2. Probably benign asymmetry in the central medial right breast.
3. Probably benign small sonographic mass right breast at 2 o'clock,
3 cm the nipple, posterior depth.

RECOMMENDATION:
1. Short-term follow-up with diagnostic right breast mammography and
ultrasound in 6 months.

I have discussed the findings and recommendations with the patient.
If applicable, a reminder letter will be sent to the patient
regarding the next appointment.

BI-RADS CATEGORY  3: Probably benign.

## 2023-11-30 ENCOUNTER — Inpatient Hospital Stay: Payer: 59 | Admitting: Hematology and Oncology

## 2023-11-30 ENCOUNTER — Other Ambulatory Visit: Payer: Self-pay | Admitting: Hematology and Oncology

## 2023-11-30 DIAGNOSIS — N63 Unspecified lump in unspecified breast: Secondary | ICD-10-CM

## 2023-11-30 DIAGNOSIS — Z9189 Other specified personal risk factors, not elsewhere classified: Secondary | ICD-10-CM

## 2023-12-01 ENCOUNTER — Telehealth: Payer: Self-pay

## 2023-12-01 NOTE — Telephone Encounter (Signed)
 Called to confirm appt for 6/26

## 2023-12-02 ENCOUNTER — Inpatient Hospital Stay: Attending: Hematology and Oncology | Admitting: Hematology and Oncology

## 2023-12-02 VITALS — BP 115/54 | HR 65 | Temp 99.1°F | Resp 16 | Ht 63.0 in | Wt 133.4 lb

## 2023-12-02 DIAGNOSIS — Z8041 Family history of malignant neoplasm of ovary: Secondary | ICD-10-CM | POA: Diagnosis not present

## 2023-12-02 DIAGNOSIS — Z9189 Other specified personal risk factors, not elsewhere classified: Secondary | ICD-10-CM | POA: Diagnosis not present

## 2023-12-02 DIAGNOSIS — Z803 Family history of malignant neoplasm of breast: Secondary | ICD-10-CM | POA: Diagnosis not present

## 2023-12-02 DIAGNOSIS — N6312 Unspecified lump in the right breast, upper inner quadrant: Secondary | ICD-10-CM | POA: Insufficient documentation

## 2023-12-02 DIAGNOSIS — K219 Gastro-esophageal reflux disease without esophagitis: Secondary | ICD-10-CM | POA: Insufficient documentation

## 2023-12-02 NOTE — Progress Notes (Signed)
 [facility] CONSULT NOTE  Patient Care Team: System, Provider Not In as PCP - General  CHIEF COMPLAINTS/PURPOSE OF CONSULTATION:  High risk breast cancer clinic.  ASSESSMENT & PLAN:   1.  Family history of breast cancer in mother and maternal cousin  Assessment and Plan Assessment & Plan At high risk of BC, life time risk of BC is about 25% Right-sided nipple discharge resolved. Previous imaging showed a probably benign finding at the right breast two o'clock position, unchanged and not palpable. - Order MRI at the breast center for December. - Review mammogram reports once available. - continue SBE monthly and examination with a provider every 6 months - FU with me in 1 yr.   HISTORY OF PRESENTING ILLNESS:   Kathryn Bradley 38 y.o. female is here because of Life time risk of BC per TC model to be about 25%  This is a very pleasant 38 year old female patient who is very healthy referred to our high-risk breast cancer clinic given her family history of breast cancer. She has significant family history of breast cancer in her mom who had bilateral breast cancer, breast cancer at the age of 5 in the setting of breast cancer at the age of 68 which was triple negative and she died from breast cancer at the age of 71.  Kathryn Bradley also has a maternal cousin who had breast cancer in her mid 53s.  Maternal grandmother had ovarian cancer.  Ms./personally has never had any breast biopsies or any known breast abnormalities.  She is healthy at baseline, has never had a mammogram but does self breast exam and denies any breast findings.  She denies any birth control usage.    Age of menarche: 39 Age at first pregnancy: 7 No birth control  Interval history  Discussed the use of AI scribe software for clinical note transcription with the patient, who gave verbal consent to proceed.  History of Present Illness Kathryn Bradley is a 38 year old female who presents for follow-up regarding  breast imaging and prior nipple discharge.  In June 2024, a mammogram revealed a probably benign mass in the right breast at the two o'clock position. The mass is not palpable, and there has been no change noted in previous imaging. Her breasts are of C density, which is not extremely dense,  She previously experienced right-sided nipple discharge, which has since resolved. Currently, there is no nipple discharge, no changes in breast tissue, and she has regular menstruation.  An MRI was approved but had to be performed at a breast center rather than a hospital due to cost issues related to facility fees.   She is currently taking Allegra and has discontinued all other medications, including Topamax , which she never took. She prefers to avoid medications that affect her well-being, especially with a young child at home.  She has one daughter who is 67 years old and will start kindergarten soon. She acknowledges the need to improve her diet and exercise routine but finds it challenging to find time.  Rest of the pertinent 10 point ROS reviewed and negative  MEDICAL HISTORY:  Past Medical History:  Diagnosis Date   Abdominal pain    Allergic rhinitis    Anxiety    Asthma    Depression    Esophageal reflux    Family history of breast cancer    Family history of colon cancer    Family history of ovarian cancer    Lactose intolerance  Migraine headache     SURGICAL HISTORY: Past Surgical History:  Procedure Laterality Date   WISDOM TOOTH EXTRACTION  2005    SOCIAL HISTORY: Social History   Socioeconomic History   Marital status: Married    Spouse name: Not on file   Number of children: Not on file   Years of education: Not on file   Highest education level: Associate degree: academic program  Occupational History   Not on file  Tobacco Use   Smoking status: Never Smoker   Smokeless tobacco: Never Used  Vaping Use   Vaping Use: Never used  Substance and Sexual  Activity   Alcohol use: No   Drug use: No   Sexual activity: Yes  Other Topics Concern   Not on file  Social History Narrative      Lives at home with her husband   Right handed   No caffeine   Social Determinants of Corporate investment banker Strain: Not on file  Food Insecurity: Not on file  Transportation Needs: Not on file  Physical Activity: Not on file  Stress: Not on file  Social Connections: Not on file  Intimate Partner Violence: Not on file    FAMILY HISTORY: Family History  Problem Relation Age of Onset   Breast cancer Mother 7       triple negative   Heart attack Mother    Heart disease Mother    Diabetes Mother    Heart Problems Father 45   Heart disease Father    Diabetes Maternal Grandmother    Ovarian cancer Maternal Grandmother    Macular degeneration Maternal Grandmother    Colon cancer Maternal Grandmother    Kidney cancer Maternal Grandfather    Bladder Cancer Maternal Grandfather    Skin cancer Maternal Grandfather    Diabetes Maternal Grandfather    Breast cancer Cousin        dx late 76s, mat first cousin   Macular degeneration Maternal Aunt     ALLERGIES:  is allergic to lactose intolerance (gi).  MEDICATIONS:  Current Outpatient Medications  Medication Sig Dispense Refill   [employer] SUNSCREEN SPF 30 EX apply     Fexofenadine HCl (ALLEGRA PO) Take 1 tablet by mouth daily.     No current facility-administered medications for this visit.     PHYSICAL EXAMINATION:  ECOG PERFORMANCE STATUS: 0 - Asymptomatic  Vitals:   12/02/23 1416  BP: (!) 115/54  Pulse: 65  Resp: 16  Temp: 99.1 F (37.3 C)  SpO2: 100%     Filed Weights   12/02/23 1416  Weight: 133 lb 6.4 oz (60.5 kg)    GENERAL:alert, no distress and comfortable Neck: No palpable cervical adenopathy Breast exam: Bilateral breasts inspected and palpated.  No palpable masses or regional adenopathy.  I could not palpate any masses today.   LABORATORY DATA:  I have  reviewed the data as listed Lab Results  Component Value Date   WBC 13.3 (H) 03/01/2018   HGB 10.0 (L) 03/01/2018   HCT 29.4 (L) 03/01/2018   MCV 91.3 03/01/2018   PLT 174 03/01/2018     Chemistry   No results found for: NA, K, CL, CO2, BUN, CREATININE, GLU No results found for: CALCIUM, ALKPHOS, AST, ALT, BILITOT    Lifetime risk of breast cancer per [employer] model based on personal history and family history is about 27%.   RADIOGRAPHIC STUDIES: I have personally reviewed the radiological images as listed and agreed with the  findings in the report. No results found.  All questions were answered. The patient knows to call the clinic with any problems, questions or concerns. I spent in the care of this patient including H and P, review of records, counseling and coordination of care.     Amber Stalls, MD 12/02/2023 2:24 PM

## 2023-12-16 ENCOUNTER — Other Ambulatory Visit: Payer: Self-pay | Admitting: Hematology and Oncology

## 2023-12-16 DIAGNOSIS — N63 Unspecified lump in unspecified breast: Secondary | ICD-10-CM

## 2023-12-21 ENCOUNTER — Other Ambulatory Visit

## 2023-12-21 ENCOUNTER — Encounter

## 2023-12-29 ENCOUNTER — Ambulatory Visit
Admission: RE | Admit: 2023-12-29 | Discharge: 2023-12-29 | Disposition: A | Discharge status: Home / Self Care | Source: Ambulatory Visit | Attending: Adult Health | Admitting: Adult Health

## 2023-12-29 DIAGNOSIS — N63 Unspecified lump in unspecified breast: Secondary | ICD-10-CM

## 2024-04-11 ENCOUNTER — Telehealth: Payer: Self-pay | Admitting: *Deleted

## 2024-04-11 NOTE — Telephone Encounter (Signed)
 Spoke with the patient regarding the referral to GYN oncology. Patient scheduled as new patient with Dr Viktoria on 11/13 at 9:45 am. Patient given an arrival time of 9:30 am. Explained to the patient the the doctor will perform a pelvic exam at this visit. Patient given the policy that only one visitor allowed and that visitor must be over 16 yrs are allowed in the Cancer Center. Patient given the address/phone number for the clinic and that the center offers free valet service. Patient aware that masks optional.

## 2024-04-18 ENCOUNTER — Encounter: Payer: Self-pay | Admitting: Gynecologic Oncology

## 2024-04-19 NOTE — Progress Notes (Signed)
 GYNECOLOGIC ONCOLOGY NEW PATIENT CONSULTATION   Patient Name: Kathryn Bradley  Patient Age: 38 y.o. Date of Service: 04/20/24 Referring Provider: Orischak, Alexandra J, FNP [address] [place],  [place] [address]   Primary Care Provider: System, Provider Not In Consulting Provider: Comer Dollar, MD   Assessment/Plan:  Premenopausal patient with high-grade vulvar dysplasia.  We reviewed recent biopsy showing high-grade vulvar dysplasia.  I discussed with the patient and her husband different pathways that can lead to vulvar dysplasia, 1 driven by the human papilloma virus and the other in the setting of lichen sclerosus.  Patient has a history of intermittent vulvar pruritus that is suggestive of lichen sclerosus but on exam today does not have characteristic skin findings that would suggest this diagnosis.  Most recent Pap 2 years ago was negative for HPV.  She denies any history of HPV or abnormal Pap test.  We will ask [facility] to add on p16 testing.  In the setting of high-grade vulvar dysplasia, we reviewed treatment options including surveillance, topical treatment, and surgical treatment with either excision or laser ablation.  Given her symptoms, I suggest that we proceed with surgical treatment.  With small size and unifocal lesion, I would favor surgical excision which will also have the benefit of ruling out malignancy and assessing for lichen sclerosus.    After discussing the risks and benefits of treatment options, we will proceed with surgical treatment with the plan wide local excision, possible additional vulvar biopsies, and any other indicated procedures.  We discussed the risks which include but are not limited to bleeding, need for blood transfusion, infection, damage to rounding structures, VTE, postoperative pneumonia, stroke, heart attack, and very rarely death.  All questions answered.  Perioperative instructions reviewed.  We discussed need for long-term  surveillance regardless of the pathway driving this dysplastic process which will likely involve visits every 6 months initially with her OB/GYN.  Given longstanding history of constipation, discussed bowel regimen especially in the setting of upcoming surgery that is in close proximity to her anus.  A copy of this note was sent to the patient's referring provider.   60 minutes of total time was spent for this patient encounter, including preparation, face-to-face counseling with the patient and coordination of care, and documentation of the encounter.  Comer Dollar, MD  Division of Gynecologic Oncology  Department of Obstetrics and Gynecology  [facility]  ___________________________________________  Chief Complaint: Chief Complaint  Patient presents with   VIN3    History of Present Illness:  Kathryn Bradley is a 38 y.o. y.o. female who is seen in consultation at the request of Orischak, Alexandra J, * for an evaluation of high-grade vulvar dysplasia.  Patient was seen in April for a yeast infection.  Completed 2 rounds of treatment for this without much change in her symptoms.  Denies any discharge but continued to have pruritus.  Seen in July with raised white lesion measuring 5 mm at right fourchette and associated pruritus.  She used triamcinolone 0.1% cream BID for 2 weeks in July and again September. Pruritus resolved but lesion still present. She felt tearing sensation at perineum intermittently when wiping after bowel movements or voiding.  Right vulvar biopsy on 04/06/24: HSIL/VIN3, margins involved.  Today, she comes in with her husband.  She notes a long history of intermittent vulvar itching.  Remembers in 2018 having persistent itching for months which then suddenly stopped on its own.  Pap 05/2022: NILM, HR HPV  neg  She endorses decreased appetite since receiving biopsy results.  Has occasional nausea at baseline, no emesis.  Denies any urinary  symptoms.  Has had constipation for as long as she can remember, having a 1-2 bowel movements a week.  PAST MEDICAL HISTORY:  Past Medical History:  Diagnosis Date   Abdominal pain    Allergic rhinitis    Anxiety    Asthma    Depression    Esophageal reflux    Family history of breast cancer    Family history of colon cancer    Family history of ovarian cancer    Lactose intolerance    Migraine headache      PAST SURGICAL HISTORY:  Past Surgical History:  Procedure Laterality Date   WISDOM TOOTH EXTRACTION  2005    OB/GYN HISTORY:  OB History  Gravida Para Term Preterm AB Living  2 1 1  0 1 1  SAB IAB Ectopic Multiple Live Births  0 1 0 0 1    # Outcome Date GA Lbr Len/2nd Weight Sex Type Anes PTL Lv  2 Term 02/28/18 [redacted]w[redacted]d 14:53 / 03:30 8 lb 9 oz (3.884 kg) F Vag-Spont EPI  LIV  1 IAB             No LMP recorded.  Age at menarche: 40  Hx of STDs: denies Last pap: 2023 History of abnormal pap smears: denies  SCREENING STUDIES:  Last mammogram: 2025  Last colonoscopy: n/a  MEDICATIONS: Outpatient Encounter Medications as of 04/20/2024  Medication Sig   Fexofenadine HCl (ALLEGRA PO) Take 1 tablet by mouth daily.   senna-docusate (SENOKOT-S) 8.6-50 MG tablet Take 2 tablets by mouth at bedtime. Do not take if having diarrhea   traMADol (ULTRAM) 50 MG tablet Take 1 tablet (50 mg total) by mouth every 6 (six) hours as needed for moderate pain (pain score 4-6). For AFTER surgery only, do not take and drive   [employer] SUNSCREEN SPF 30 EX apply (Patient not taking: Reported on 04/18/2024)   scopolamine (TRANSDERM-SCOP) 1 MG/3DAYS APPLY 1 PATCH ONTO THE SKIN EVERY 3 DAYS AS NEEDED FOR MOTION SICKNESS (Patient not taking: Reported on 04/18/2024)   No facility-administered encounter medications on file as of 04/20/2024.    ALLERGIES:  Allergies  Allergen Reactions   Lactose Intolerance (Gi)     bloating     FAMILY HISTORY:  Family History  Problem Relation Age of  Onset   Breast cancer Mother 48       triple negative   Heart attack Mother    Heart disease Mother    Diabetes Mother    Heart Problems Father 13   Heart disease Father    Diabetes Maternal Grandmother    Ovarian cancer Maternal Grandmother    Macular degeneration Maternal Grandmother    Colon cancer Maternal Grandmother    Kidney cancer Maternal Grandfather    Bladder Cancer Maternal Grandfather    Skin cancer Maternal Grandfather    Diabetes Maternal Grandfather    Breast cancer Cousin        dx late 50s, mat first cousin   Macular degeneration Maternal Aunt      SOCIAL HISTORY:  Social Connections: Not on file    REVIEW OF SYSTEMS:  + constipation Denies appetite changes, fevers, chills, fatigue, unexplained weight changes. Denies hearing loss, neck lumps or masses, mouth sores, ringing in ears or voice changes. Denies cough or wheezing.  Denies shortness of breath. Denies chest pain or palpitations. Denies  leg swelling. Denies abdominal distention, pain, blood in stools, diarrhea, nausea, vomiting, or early satiety. Denies pain with intercourse, dysuria, frequency, hematuria or incontinence. Denies hot flashes, pelvic pain, vaginal bleeding or vaginal discharge.   Denies joint pain, back pain or muscle pain/cramps. Denies itching, rash, or wounds. Denies dizziness, headaches, numbness or seizures. Denies swollen lymph nodes or glands, denies easy bruising or bleeding. Denies anxiety, depression, confusion, or decreased concentration.  Physical Exam:  Vital Signs for this encounter:  Blood pressure (!) 122/54, pulse 81, temperature 98.5 F (36.9 C), temperature source Oral, resp. rate 19, height 5' 3 (1.6 m), weight 131 lb 9.6 oz (59.7 kg), SpO2 100%. Body mass index is 23.31 kg/m. General: Alert, oriented, no acute distress.  HEENT: Normocephalic, atraumatic. Sclera anicteric.  Chest: Clear to auscultation bilaterally. No wheezes, rhonchi, or  rales. Cardiovascular: Regular rate and rhythm, no murmurs, rubs, or gallops.  Abdomen: Normoactive bowel sounds. Soft, nondistended, nontender to palpation. No masses or hepatosplenomegaly appreciated. No palpable fluid wave.  Extremities: Grossly normal range of motion. Warm, well perfused. No edema bilaterally.  Skin: No rashes or lesions.  Lymphatics: No cervical, supraclavicular, or inguinal adenopathy.  GU: Normal external female genitalia.  On exam, there is a small, less than 1 cm area of leukoplakia at the posterior fourchette (7 o'clock).  After application of 5% acetic acid, there is some mild acetowhite that extends from the superior aspect of this lesion across the midline.  The adjacent tissue laterally is mildly raised although not acetowhite.  No other vulvar acetowhite lesions are noted.  I do not see vulvar findings that look consistent with lichen sclerosus.  Vaginal mucosa is smooth on 1 digit bimanual exam.  Speculum exam deferred and will be performed at the time of her procedure.  LABORATORY AND RADIOLOGIC DATA:  Outside medical records were reviewed to synthesize the above history, along with the history and physical obtained during the visit.   Lab Results  Component Value Date   WBC 13.3 (H) 03/01/2018   HGB 10.0 (L) 03/01/2018   HCT 29.4 (L) 03/01/2018   PLT 174 03/01/2018

## 2024-04-19 NOTE — H&P (View-Only) (Signed)
 GYNECOLOGIC ONCOLOGY NEW PATIENT CONSULTATION   Patient Name: Kathryn Bradley  Patient Age: 38 y.o. Date of Service: 04/20/24 Referring Provider: Orischak, Alexandra J, FNP [address] [place],  [place] [address]   Primary Care Provider: System, Provider Not In Consulting Provider: Comer Dollar, MD   Assessment/Plan:  Premenopausal patient with high-grade vulvar dysplasia.  We reviewed recent biopsy showing high-grade vulvar dysplasia.  I discussed with the patient and her husband different pathways that can lead to vulvar dysplasia, 1 driven by the human papilloma virus and the other in the setting of lichen sclerosus.  Patient has a history of intermittent vulvar pruritus that is suggestive of lichen sclerosus but on exam today does not have characteristic skin findings that would suggest this diagnosis.  Most recent Pap 2 years ago was negative for HPV.  She denies any history of HPV or abnormal Pap test.  We will ask [facility] to add on p16 testing.  In the setting of high-grade vulvar dysplasia, we reviewed treatment options including surveillance, topical treatment, and surgical treatment with either excision or laser ablation.  Given her symptoms, I suggest that we proceed with surgical treatment.  With small size and unifocal lesion, I would favor surgical excision which will also have the benefit of ruling out malignancy and assessing for lichen sclerosus.    After discussing the risks and benefits of treatment options, we will proceed with surgical treatment with the plan wide local excision, possible additional vulvar biopsies, and any other indicated procedures.  We discussed the risks which include but are not limited to bleeding, need for blood transfusion, infection, damage to rounding structures, VTE, postoperative pneumonia, stroke, heart attack, and very rarely death.  All questions answered.  Perioperative instructions reviewed.  We discussed need for long-term  surveillance regardless of the pathway driving this dysplastic process which will likely involve visits every 6 months initially with her OB/GYN.  Given longstanding history of constipation, discussed bowel regimen especially in the setting of upcoming surgery that is in close proximity to her anus.  A copy of this note was sent to the patient's referring provider.   60 minutes of total time was spent for this patient encounter, including preparation, face-to-face counseling with the patient and coordination of care, and documentation of the encounter.  Comer Dollar, MD  Division of Gynecologic Oncology  Department of Obstetrics and Gynecology  [facility]  ___________________________________________  Chief Complaint: Chief Complaint  Patient presents with   VIN3    History of Present Illness:  Kathryn Bradley is a 38 y.o. y.o. female who is seen in consultation at the request of Orischak, Alexandra J, * for an evaluation of high-grade vulvar dysplasia.  Patient was seen in April for a yeast infection.  Completed 2 rounds of treatment for this without much change in her symptoms.  Denies any discharge but continued to have pruritus.  Seen in July with raised white lesion measuring 5 mm at right fourchette and associated pruritus.  She used triamcinolone 0.1% cream BID for 2 weeks in July and again September. Pruritus resolved but lesion still present. She felt tearing sensation at perineum intermittently when wiping after bowel movements or voiding.  Right vulvar biopsy on 04/06/24: HSIL/VIN3, margins involved.  Today, she comes in with her husband.  She notes a long history of intermittent vulvar itching.  Remembers in 2018 having persistent itching for months which then suddenly stopped on its own.  Pap 05/2022: NILM, HR HPV  neg  She endorses decreased appetite since receiving biopsy results.  Has occasional nausea at baseline, no emesis.  Denies any urinary  symptoms.  Has had constipation for as long as she can remember, having a 1-2 bowel movements a week.  PAST MEDICAL HISTORY:  Past Medical History:  Diagnosis Date   Abdominal pain    Allergic rhinitis    Anxiety    Asthma    Depression    Esophageal reflux    Family history of breast cancer    Family history of colon cancer    Family history of ovarian cancer    Lactose intolerance    Migraine headache      PAST SURGICAL HISTORY:  Past Surgical History:  Procedure Laterality Date   WISDOM TOOTH EXTRACTION  2005    OB/GYN HISTORY:  OB History  Gravida Para Term Preterm AB Living  2 1 1  0 1 1  SAB IAB Ectopic Multiple Live Births  0 1 0 0 1    # Outcome Date GA Lbr Len/2nd Weight Sex Type Anes PTL Lv  2 Term 02/28/18 [redacted]w[redacted]d 14:53 / 03:30 8 lb 9 oz (3.884 kg) F Vag-Spont EPI  LIV  1 IAB             No LMP recorded.  Age at menarche: 40  Hx of STDs: denies Last pap: 2023 History of abnormal pap smears: denies  SCREENING STUDIES:  Last mammogram: 2025  Last colonoscopy: n/a  MEDICATIONS: Outpatient Encounter Medications as of 04/20/2024  Medication Sig   Fexofenadine HCl (ALLEGRA PO) Take 1 tablet by mouth daily.   senna-docusate (SENOKOT-S) 8.6-50 MG tablet Take 2 tablets by mouth at bedtime. Do not take if having diarrhea   traMADol (ULTRAM) 50 MG tablet Take 1 tablet (50 mg total) by mouth every 6 (six) hours as needed for moderate pain (pain score 4-6). For AFTER surgery only, do not take and drive   [employer] SUNSCREEN SPF 30 EX apply (Patient not taking: Reported on 04/18/2024)   scopolamine (TRANSDERM-SCOP) 1 MG/3DAYS APPLY 1 PATCH ONTO THE SKIN EVERY 3 DAYS AS NEEDED FOR MOTION SICKNESS (Patient not taking: Reported on 04/18/2024)   No facility-administered encounter medications on file as of 04/20/2024.    ALLERGIES:  Allergies  Allergen Reactions   Lactose Intolerance (Gi)     bloating     FAMILY HISTORY:  Family History  Problem Relation Age of  Onset   Breast cancer Mother 48       triple negative   Heart attack Mother    Heart disease Mother    Diabetes Mother    Heart Problems Father 13   Heart disease Father    Diabetes Maternal Grandmother    Ovarian cancer Maternal Grandmother    Macular degeneration Maternal Grandmother    Colon cancer Maternal Grandmother    Kidney cancer Maternal Grandfather    Bladder Cancer Maternal Grandfather    Skin cancer Maternal Grandfather    Diabetes Maternal Grandfather    Breast cancer Cousin        dx late 50s, mat first cousin   Macular degeneration Maternal Aunt      SOCIAL HISTORY:  Social Connections: Not on file    REVIEW OF SYSTEMS:  + constipation Denies appetite changes, fevers, chills, fatigue, unexplained weight changes. Denies hearing loss, neck lumps or masses, mouth sores, ringing in ears or voice changes. Denies cough or wheezing.  Denies shortness of breath. Denies chest pain or palpitations. Denies  leg swelling. Denies abdominal distention, pain, blood in stools, diarrhea, nausea, vomiting, or early satiety. Denies pain with intercourse, dysuria, frequency, hematuria or incontinence. Denies hot flashes, pelvic pain, vaginal bleeding or vaginal discharge.   Denies joint pain, back pain or muscle pain/cramps. Denies itching, rash, or wounds. Denies dizziness, headaches, numbness or seizures. Denies swollen lymph nodes or glands, denies easy bruising or bleeding. Denies anxiety, depression, confusion, or decreased concentration.  Physical Exam:  Vital Signs for this encounter:  Blood pressure (!) 122/54, pulse 81, temperature 98.5 F (36.9 C), temperature source Oral, resp. rate 19, height 5' 3 (1.6 m), weight 131 lb 9.6 oz (59.7 kg), SpO2 100%. Body mass index is 23.31 kg/m. General: Alert, oriented, no acute distress.  HEENT: Normocephalic, atraumatic. Sclera anicteric.  Chest: Clear to auscultation bilaterally. No wheezes, rhonchi, or  rales. Cardiovascular: Regular rate and rhythm, no murmurs, rubs, or gallops.  Abdomen: Normoactive bowel sounds. Soft, nondistended, nontender to palpation. No masses or hepatosplenomegaly appreciated. No palpable fluid wave.  Extremities: Grossly normal range of motion. Warm, well perfused. No edema bilaterally.  Skin: No rashes or lesions.  Lymphatics: No cervical, supraclavicular, or inguinal adenopathy.  GU: Normal external female genitalia.  On exam, there is a small, less than 1 cm area of leukoplakia at the posterior fourchette (7 o'clock).  After application of 5% acetic acid, there is some mild acetowhite that extends from the superior aspect of this lesion across the midline.  The adjacent tissue laterally is mildly raised although not acetowhite.  No other vulvar acetowhite lesions are noted.  I do not see vulvar findings that look consistent with lichen sclerosus.  Vaginal mucosa is smooth on 1 digit bimanual exam.  Speculum exam deferred and will be performed at the time of her procedure.  LABORATORY AND RADIOLOGIC DATA:  Outside medical records were reviewed to synthesize the above history, along with the history and physical obtained during the visit.   Lab Results  Component Value Date   WBC 13.3 (H) 03/01/2018   HGB 10.0 (L) 03/01/2018   HCT 29.4 (L) 03/01/2018   PLT 174 03/01/2018

## 2024-04-20 ENCOUNTER — Inpatient Hospital Stay: Admitting: Gynecologic Oncology

## 2024-04-20 ENCOUNTER — Telehealth: Payer: Self-pay | Admitting: Oncology

## 2024-04-20 ENCOUNTER — Inpatient Hospital Stay: Attending: Gynecologic Oncology | Admitting: Gynecologic Oncology

## 2024-04-20 ENCOUNTER — Encounter: Payer: Self-pay | Admitting: Gynecologic Oncology

## 2024-04-20 VITALS — BP 122/54 | HR 81 | Temp 98.5°F | Resp 19 | Ht 63.0 in | Wt 131.6 lb

## 2024-04-20 DIAGNOSIS — Z8041 Family history of malignant neoplasm of ovary: Secondary | ICD-10-CM | POA: Diagnosis not present

## 2024-04-20 DIAGNOSIS — Z8051 Family history of malignant neoplasm of kidney: Secondary | ICD-10-CM | POA: Insufficient documentation

## 2024-04-20 DIAGNOSIS — K5900 Constipation, unspecified: Secondary | ICD-10-CM | POA: Insufficient documentation

## 2024-04-20 DIAGNOSIS — K219 Gastro-esophageal reflux disease without esophagitis: Secondary | ICD-10-CM | POA: Insufficient documentation

## 2024-04-20 DIAGNOSIS — D071 Carcinoma in situ of vulva: Secondary | ICD-10-CM | POA: Diagnosis not present

## 2024-04-20 DIAGNOSIS — Z808 Family history of malignant neoplasm of other organs or systems: Secondary | ICD-10-CM | POA: Diagnosis not present

## 2024-04-20 DIAGNOSIS — F32A Depression, unspecified: Secondary | ICD-10-CM | POA: Insufficient documentation

## 2024-04-20 DIAGNOSIS — N904 Leukoplakia of vulva: Secondary | ICD-10-CM | POA: Insufficient documentation

## 2024-04-20 DIAGNOSIS — Z8052 Family history of malignant neoplasm of bladder: Secondary | ICD-10-CM | POA: Diagnosis not present

## 2024-04-20 DIAGNOSIS — R102 Pelvic and perineal pain unspecified side: Secondary | ICD-10-CM

## 2024-04-20 DIAGNOSIS — Z803 Family history of malignant neoplasm of breast: Secondary | ICD-10-CM | POA: Insufficient documentation

## 2024-04-20 DIAGNOSIS — L292 Pruritus vulvae: Secondary | ICD-10-CM

## 2024-04-20 DIAGNOSIS — Z800 Family history of malignant neoplasm of digestive organs: Secondary | ICD-10-CM | POA: Diagnosis not present

## 2024-04-20 DIAGNOSIS — E739 Lactose intolerance, unspecified: Secondary | ICD-10-CM | POA: Diagnosis not present

## 2024-04-20 MED ORDER — SENNOSIDES-DOCUSATE SODIUM 8.6-50 MG PO TABS
2.0000 | ORAL_TABLET | Freq: Every day | ORAL | 2 refills | Status: AC
Start: 2024-04-20 — End: ?

## 2024-04-20 MED ORDER — TRAMADOL HCL 50 MG PO TABS
50.0000 mg | ORAL_TABLET | Freq: Four times a day (QID) | ORAL | 0 refills | Status: AC | PRN
Start: 2024-04-20 — End: ?

## 2024-04-20 NOTE — Patient Instructions (Addendum)
 Preparing for your Surgery  Plan for surgery on May 11, 2024 with Dr. Comer Dollar at [facility]. You will be scheduled for examination under anesthesia, wide local excision of the vulva versus laser of the vulva, possible biopsies, and any other indicated procedures.   We will plan on giving you a vulvar after surgery kit including a donut pillow, topical lidocaine , peri bottle.  You can plan on starting the sennakot-S now to help get your bowels moving. We will also recommend using this after surgery.  Pre-operative Testing -You will receive a phone call from presurgical testing at [facility] to discuss surgery instructions and arrange for lab work if needed.  -Bring your insurance card, copy of an advanced directive if applicable, medication list.  -You should not be taking blood thinners or aspirin at least ten days prior to surgery unless instructed by your surgeon.  -Do not take supplements such as fish oil (omega 3), red yeast rice, turmeric before your surgery. You want to avoid medications with aspirin in them including headache powders such as BC or Goody's), Excedrin migraine.  Day Before Surgery at Home -You will be advised you can have clear liquids up until 3 hours before your surgery.    Your role in recovery Your role is to become active as soon as directed by your doctor, while still giving yourself time to heal.  Rest when you feel tired. You will be asked to do the following in order to speed your recovery:  - Cough and breathe deeply. This helps to clear and expand your lungs and can prevent pneumonia after surgery.  - STAY ACTIVE WHEN YOU GET HOME. Do mild physical activity. Walking or moving your legs help your circulation and body functions return to normal. Do not try to get up or walk alone the first time after surgery.   -If you develop swelling on one leg or the other, pain in the back of your leg, redness/warmth in one of your legs,  please call the office or go to the Emergency Room to have a doppler to rule out a blood clot. For shortness of breath, chest pain-seek care in the Emergency Room as soon as possible. - Actively manage your pain. Managing your pain lets you move in comfort. We will ask you to rate your pain on a scale of zero to 10. It is your responsibility to tell your doctor or nurse where and how much you hurt so your pain can be treated.  Special Considerations -Your final pathology results from surgery should be available around one week after surgery and the results will be relayed to you when available.  -FMLA forms can be faxed to [phone] and please allow 5-7 business days for completion.  Pain Management After Surgery -You will be prescribed your pain medication (tramadol) and bowel regimen medications before surgery so that you can have these available when you are discharged from the hospital. The pain medication is for use ONLY AFTER surgery and a new prescription will not be given.   -Make sure that you have Tylenol  or ibuprofen  at home IF YOU ARE ABLE TO TAKE THESE MEDICATION to use on a regular basis after surgery for pain control.   -Review the attached handout on narcotic use and their risks and side effects.   Bowel Regimen -You will be prescribed Sennakot-S to take nightly to prevent constipation especially if you are taking the narcotic pain medication intermittently.  It is important to prevent  constipation and drink adequate amounts of liquids. You can stop taking this medication when you are not taking pain medication and you are back on your normal bowel routine.  Risks of Surgery Risks of surgery are low but include bleeding, infection, damage to surrounding structures, re-operation, blood clots, and very rarely death.  AFTER SURGERY INSTRUCTIONS  Return to work:  1-2 weeks if applicable  We recommend purchasing several bags of frozen green peas and dividing them into ziploc  bags. You will want to keep these in the freezer and have them ready to use as ice packs to the vulvar incision. Once the ice pack is no longer cold, you can get another from the freezer. The frozen peas mold to your body better than a regular ice pack.   You can use topical lidocaine  after surgery as a topical gel for pain relief.   Activity: 1. Be up and out of the bed during the day.  Take a nap if needed.  You may walk up steps but be careful and use the hand rail.  Stair climbing will tire you more than you think, you may need to stop part way and rest.   2. No lifting or straining for 4 weeks over 10 pounds. No pushing, pulling, straining for 4 weeks.  3. No driving for minimum 24 hours after surgery but this is usually longer until the following criteria have been met: Do not drive if you are taking narcotic pain medicine and make sure that your reaction time has returned.   4. You can shower as soon as the next day after surgery. Shower daily. No tub baths or submerging your body in water until cleared by your surgeon. If you have the soap that was given to you by pre-surgical testing that was used before surgery, you do not need to use it afterwards because this can irritate your incisions.   5. No sexual activity and nothing in the vagina for 4-6 weeks.  6. You may experience vulvar spotting and discharge after surgery.  The spotting is normal but if you experience heavy bleeding, call our office.  7. Take Tylenol  and ibuprofen  first for pain if you are able to take these medication and only use narcotic pain medication for severe pain not relieved by the Tylenol  or ibuprofen .  Monitor your Tylenol  intake to a max of 4,000 mg in a 24 hour period.   Diet: 1. Low sodium Heart Healthy Diet is recommended but you are cleared to resume your normal (before surgery) diet after your procedure.  2. It is safe to use a laxative, such as Miralax or Colace, if you have difficulty moving your  bowels. You have been prescribed Sennakot at bedtime every evening to keep bowel movements regular and to prevent constipation.    Wound Care: 1. Keep clean and dry.  Shower daily.  Reasons to call the Doctor: Fever - Oral temperature greater than 100.4 degrees Fahrenheit Foul-smelling vaginal discharge Difficulty urinating Nausea and vomiting Increased pain at the site of the incision that is unrelieved with pain medicine. Difficulty breathing with or without chest pain New calf pain especially if only on one side Sudden, continuing increased vaginal bleeding with or without clots.   Contacts: For questions or concerns you should contact:  Dr. Comer Dollar at [phone]  Eleanor Epps, NP at [phone]  After Hours: call [phone] and have the GYN Oncologist paged/contacted (after 5 pm or on the weekends).  Messages sent via mychart are for  non-urgent matters and are not responded to after hours so for urgent needs, please call the after hours number.

## 2024-04-20 NOTE — Telephone Encounter (Signed)
 Received a call back from Dr. Barbra with [facility].  He said the vulvar biopsy result was VIN 3 and it was HPV dependent. He does not think further testing needs to be done to show it was caused by HPV.

## 2024-04-20 NOTE — Progress Notes (Signed)
 Patient here for a consult with Dr. Viktoria and for a pre-operative appointment prior to her scheduled surgery on 05/11/2024. She is scheduled for a examination under anesthesia, wide local excision of the vulva versus laser of the vulva, possible biopsies, and any other indicated procedures. The surgery was discussed in detail.  See after visit summary for additional details. .      Discussed post-op pain management in detail including the aspects of the enhanced recovery pathway.  Advised her that a new prescription would be sent in for Tramadol and it is only to be used for after her upcoming surgery.  We discussed the use of tylenol  post-op and to monitor for a maximum of 4,000 mg in a 24 hour period.  Also prescribed sennakot to be used after surgery and to hold if having loose stools.  Discussed bowel regimen in detail.     Discussed the use of SCDs and measures to take at home to prevent DVT including frequent mobility.  Reportable signs and symptoms of DVT discussed. Post-operative instructions discussed and expectations for after surgery. Incisional care discussed as well including reportable signs and symptoms including erythema, drainage, wound separation.     30 minutes spent with the patient.  Verbalizing understanding of material discussed. No needs or concerns voiced at the end of the visit.   Advised patient to call for any needs.  Advised that her post-operative medications had been prescribed and could be picked up at any time.    This appointment is included in the global surgical bundle as pre-operative teaching and has no charge.

## 2024-04-20 NOTE — Telephone Encounter (Signed)
 Called [facility] and talked to Candace.  Asked for p16 or additional testing on specimen ID [id number].  Also faxed request to [phone].

## 2024-04-21 ENCOUNTER — Other Ambulatory Visit: Payer: Self-pay | Admitting: Gynecologic Oncology

## 2024-04-21 DIAGNOSIS — D071 Carcinoma in situ of vulva: Secondary | ICD-10-CM

## 2024-04-27 ENCOUNTER — Encounter: Payer: Self-pay | Admitting: Gynecologic Oncology

## 2024-04-27 NOTE — Patient Instructions (Signed)
 SURGICAL WAITING ROOM VISITATION Patients having surgery or a procedure may have no more than 2 support people in the waiting area - these visitors may rotate in the visitor waiting room.   If the patient needs to stay at the hospital during part of their recovery, the visitor guidelines for inpatient rooms apply.  PRE-OP VISITATION  Pre-op nurse will coordinate an appropriate time for 1 support person to accompany the patient in pre-op.  This support person may not rotate.  This visitor will be contacted when the time is appropriate for the visitor to come back in the pre-op area.  Please refer to the [facility] website for the visitor guidelines for Inpatients (after your surgery is over and you are in a regular room).  You are not required to quarantine at this time prior to your surgery. However, you must do this: Hand Hygiene often Do NOT share personal items Notify your provider if you are in close contact with someone who has COVID or you develop fever 100.4 or greater, new onset of sneezing, cough, sore throat, shortness of breath or body aches.  If you test positive for Covid or have been in contact with anyone that has tested positive in the last 10 days please notify you surgeon.    Your procedure is scheduled on:  Thursday 05-11-2024  Report to [facility] Main Entrance: [address] entrance where the [employer] is available.   Report to admitting at:  05:15   AM  Call this number if you have any questions or problems the morning of surgery [phone]  FOLLOW ANY ADDITIONAL PRE OP INSTRUCTIONS YOU RECEIVED FROM YOUR SURGEON'S OFFICE!!!  Do not eat food after Midnight the night prior to your surgery/procedure.  After Midnight you may have the following liquids until  04:30 AM DAY OF SURGERY  Clear Liquid Diet Water Black Coffee (sugar ok, NO MILK/CREAM OR CREAMERS)  Tea (sugar ok, NO MILK/CREAM OR CREAMERS) regular and decaf                             Plain  Jell-O  with no fruit (NO RED)                                           Fruit ices (not with fruit pulp, NO RED)                                     Popsicles (NO RED)                                                                  Juice: NO CITRUS JUICES: only apple, WHITE grape, WHITE cranberry Sports drinks like Gatorade or Powerade (NO RED)                 Oral Hygiene is also important to reduce your risk of infection.        Remember - BRUSH YOUR TEETH THE MORNING OF SURGERY WITH YOUR REGULAR TOOTHPASTE  Do NOT smoke after Midnight  the night before surgery.  STOP TAKING all Vitamins, Herbs and supplements 1 week before your surgery.   Take ONLY these medicines the morning of surgery with A SIP OF WATER: Fexofenadine                   You may not have any metal on your body including hair pins, jewelry, and body piercing  Do not wear make-up, lotions, powders, perfumes or deodorant  Do not wear nail polish including gel and S&S, artificial / acrylic nails, or any other type of covering on natural nails including finger and toenails. If you have artificial nails, gel coating, etc., that needs to be removed by a nail salon, Please have this removed prior to surgery. Not doing so may mean that your surgery could be cancelled or delayed if the Surgeon or anesthesia staff feels like they are unable to monitor you safely.   Do not shave 48 hours prior to surgery to avoid nicks in your skin which may contribute to postoperative infections.   Contacts, Hearing Aids, dentures or bridgework may not be worn into surgery. DENTURES WILL BE REMOVED PRIOR TO SURGERY PLEASE DO NOT APPLY Poly grip OR ADHESIVES!!!  Patients discharged on the day of surgery will not be allowed to drive home.  Someone NEEDS to stay with you for the first 24 hours after anesthesia.  Do not bring your home medications to the hospital. The Pharmacy will dispense medications listed on your medication list to you during  your admission in the Hospital.  Please read over the following fact sheets you were given: IF YOU HAVE QUESTIONS ABOUT YOUR PRE-OP INSTRUCTIONS, PLEASE CALL [phone]   [facility] - Preparing for Surgery        Before surgery, you can play an important role.  Because skin is not sterile, your skin needs to be as free of germs as possible.  You can reduce the number of germs on your skin by washing with CHG (chlorahexidine gluconate) soap before surgery.  CHG is an antiseptic cleaner which kills germs and bonds with the skin to continue killing germs even after washing. Please DO NOT use if you have an allergy  to CHG or antibacterial soaps.  If your skin becomes reddened/irritated stop using the CHG and inform your nurse when you arrive at Short Stay. Do not shave (including legs and underarms) for at least 48 hours prior to the first CHG shower.  You may shave your face/neck.  Please follow these instructions carefully:  1.  Shower with CHG Soap the night before surgery ONLY (DO NOT USE THE CHG SOAP THE MORNING OF SURGERY).  2.  If you choose to wash your hair, wash your hair first as usual with your normal  shampoo.  3.  After you shampoo, rinse your hair and body thoroughly to remove the shampoo.                             4.  Use CHG as you would any other liquid soap.  You can apply chg directly to the skin and wash.  Gently with a scrungie or clean washcloth.  5.  Apply the CHG Soap to your body ONLY FROM THE NECK DOWN.   Do not use on face/ open                           Wound or  open sores. Avoid contact with eyes, ears mouth and genitals (private parts).                       Wash face,  Genitals (private parts) with your normal soap.             6.  Wash thoroughly, paying special attention to the area where your  surgery  will be performed.  7.  Thoroughly rinse your body with warm water from the neck down.  8.  DO NOT shower/wash with your normal soap after using and rinsing off  the CHG Soap.                9.  Pat yourself dry with a clean towel.            10.  Wear clean pajamas.            11.  Place clean sheets on your bed the night of your first shower and do not  sleep with pets.  Day of Surgery : Do not apply any CHG, lotions/deodorants the morning of surgery.  Please wear clean clothes to the hospital/surgery center.   FAILURE TO FOLLOW THESE INSTRUCTIONS MAY RESULT IN THE CANCELLATION OF YOUR SURGERY  PATIENT SIGNATURE_________________________________  NURSE SIGNATURE__________________________________  ________________________________________________________________________

## 2024-04-27 NOTE — Progress Notes (Signed)
 COVID Vaccine received:  []  No [x]  Yes Date of any COVID positive Test in last 90 days: none  PCP - Lyle Bertrand, NP Cardiologist -  none  Chest x-ray -  EKG -  no hx to warrant Stress Test -  ECHO -  Cardiac Cath -  CT Coronary Calcium score:   Pacemaker / ICD device [x]  No []  Yes   Spinal Cord Stimulator:[x]  No []  Yes       History of Sleep Apnea? [x]  No []  Yes   CPAP used?- [x]  No []  Yes    Patient has: [x]  NO Hx DM   []  Pre-DM   []  DM1  []   DM2 Does the patient monitor blood sugar?   [x]  N/A   []  No []  Yes   Blood Thinner / Instructions:   none Aspirin Instructions:  none  Activity level: Able to walk up 2 flights of stairs without becoming significantly short of breath or having chest pain?  []  No   [x]    Yes  Patient can perform ADLs without assistance. []  No   [x]   Yes  Anesthesia review: anxiety / depression, GERD, asthma   Patient denies any S&S of respiratory illness or Covid - no shortness of breath, fever, cough or chest pain at PAT appointment.  Patient verbalized understanding and agreement to the Pre-Surgical Instructions that were given to them at this PAT appointment. Patient was also educated of the need to review these PAT instructions again prior to her surgery.I reviewed the appropriate phone numbers to call if they have any and questions or concerns.

## 2024-04-27 NOTE — Telephone Encounter (Signed)
 Called Tyjai and let her know the response from the pathologist regarding the HPV testing.  She verbalized understanding.

## 2024-04-28 ENCOUNTER — Other Ambulatory Visit: Payer: Self-pay

## 2024-04-28 ENCOUNTER — Encounter (HOSPITAL_COMMUNITY)
Admission: RE | Admit: 2024-04-28 | Discharge: 2024-04-28 | Disposition: A | Discharge status: Home / Self Care | Source: Ambulatory Visit | Attending: Gynecologic Oncology | Admitting: Gynecologic Oncology

## 2024-04-28 ENCOUNTER — Encounter (HOSPITAL_COMMUNITY): Payer: Self-pay

## 2024-04-28 VITALS — BP 118/57 | HR 98 | Temp 98.4°F | Resp 16 | Ht 63.0 in | Wt 131.6 lb

## 2024-04-28 DIAGNOSIS — Z01812 Encounter for preprocedural laboratory examination: Secondary | ICD-10-CM | POA: Diagnosis present

## 2024-04-28 DIAGNOSIS — Z01818 Encounter for other preprocedural examination: Secondary | ICD-10-CM

## 2024-04-28 LAB — CBC
HCT: 43.7 % (ref 36.0–46.0)
Hemoglobin: 14.2 g/dL (ref 12.0–15.0)
MCH: 29.3 pg (ref 26.0–34.0)
MCHC: 32.5 g/dL (ref 30.0–36.0)
MCV: 90.3 fL (ref 80.0–100.0)
Platelets: 380 K/uL (ref 150–400)
RBC: 4.84 MIL/uL (ref 3.87–5.11)
RDW: 13 % (ref 11.5–15.5)
WBC: 8.2 K/uL (ref 4.0–10.5)
nRBC: 0 % (ref 0.0–0.2)

## 2024-04-28 LAB — BASIC METABOLIC PANEL WITH GFR
Anion gap: 15 (ref 5–15)
BUN: 11 mg/dL (ref 6–20)
CO2: 16 mmol/L — ABNORMAL LOW (ref 22–32)
Calcium: 9.6 mg/dL (ref 8.9–10.3)
Chloride: 102 mmol/L (ref 98–111)
Creatinine, Ser: 0.73 mg/dL (ref 0.44–1.00)
GFR, Estimated: >60 mL/min (ref >60)
Glucose, Bld: 80 mg/dL (ref 70–99)
Potassium: 4.5 mmol/L (ref 3.5–5.1)
Sodium: 133 mmol/L — ABNORMAL LOW (ref 135–145)

## 2024-05-10 ENCOUNTER — Telehealth: Payer: Self-pay | Admitting: *Deleted

## 2024-05-10 ENCOUNTER — Encounter (HOSPITAL_COMMUNITY): Payer: Self-pay | Admitting: Gynecologic Oncology

## 2024-05-10 NOTE — Discharge Instructions (Signed)
 AFTER SURGERY INSTRUCTIONS   Return to work:  1-2 weeks if applicable   We recommend purchasing several bags of frozen green peas and dividing them into ziploc bags. You will want to keep these in the freezer and have them ready to use as ice packs to the vulvar incision. Once the ice pack is no longer cold, you can get another from the freezer. The frozen peas mold to your body better than a regular ice pack.    You can use topical lidocaine  after surgery as a topical gel for pain relief.    Activity: 1. Be up and out of the bed during the day.  Take a nap if needed.  You may walk up steps but be careful and use the hand rail.  Stair climbing will tire you more than you think, you may need to stop part way and rest.    2. No lifting or straining for 4 weeks over 10 pounds. No pushing, pulling, straining for 4 weeks.   3. No driving for minimum 24 hours after surgery but this is usually longer until the following criteria have been met: Do not drive if you are taking narcotic pain medicine and make sure that your reaction time has returned.    4. You can shower as soon as the next day after surgery. Shower daily. No tub baths or submerging your body in water until cleared by your surgeon. If you have the soap that was given to you by pre-surgical testing that was used before surgery, you do not need to use it afterwards because this can irritate your incisions.    5. No sexual activity and nothing in the vagina for 4-6 weeks.   6. You may experience vulvar spotting and discharge after surgery.  The spotting is normal but if you experience heavy bleeding, call our office.   7. Take Tylenol  and ibuprofen  first for pain if you are able to take these medication and only use narcotic pain medication for severe pain not relieved by the Tylenol  or ibuprofen .  Monitor your Tylenol  intake to a max of 4,000 mg in a 24 hour period.    Diet: 1. Low sodium Heart Healthy Diet is recommended but you are  cleared to resume your normal (before surgery) diet after your procedure.   2. It is safe to use a laxative, such as Miralax or Colace, if you have difficulty moving your bowels. You have been prescribed Sennakot at bedtime every evening to keep bowel movements regular and to prevent constipation.     Wound Care: 1. Keep clean and dry.  Shower daily.   Reasons to call the Doctor: Fever - Oral temperature greater than 100.4 degrees Fahrenheit Foul-smelling vaginal discharge Difficulty urinating Nausea and vomiting Increased pain at the site of the incision that is unrelieved with pain medicine. Difficulty breathing with or without chest pain New calf pain especially if only on one side Sudden, continuing increased vaginal bleeding with or without clots.   Contacts: For questions or concerns you should contact:   Dr. Comer Dollar at [phone]   Eleanor Epps, NP at [phone]   After Hours: call [phone] and have the GYN Oncologist paged/contacted (after 5 pm or on the weekends).   Messages sent via mychart are for non-urgent matters and are not responded to after hours so for urgent needs, please call the after hours number.

## 2024-05-10 NOTE — Telephone Encounter (Signed)
 Telephone call to check on pre-operative status.  Patient compliant with pre-operative instructions.  Reinforced nothing to eat after midnight. Clear liquids until 0415. Patient to arrive at 0515.  No questions or concerns voiced.  Instructed to call for any needs.

## 2024-05-11 ENCOUNTER — Ambulatory Visit (HOSPITAL_COMMUNITY): Payer: Self-pay | Admitting: Medical

## 2024-05-11 ENCOUNTER — Ambulatory Visit (HOSPITAL_COMMUNITY): Payer: Self-pay

## 2024-05-11 ENCOUNTER — Other Ambulatory Visit: Payer: Self-pay

## 2024-05-11 ENCOUNTER — Encounter (HOSPITAL_COMMUNITY)
Admission: RE | Disposition: A | Discharge status: Home / Self Care | Payer: Self-pay | Source: Ambulatory Visit | Attending: Gynecologic Oncology

## 2024-05-11 ENCOUNTER — Ambulatory Visit (HOSPITAL_COMMUNITY)
Admission: RE | Admit: 2024-05-11 | Discharge: 2024-05-11 | Disposition: A | Discharge status: Home / Self Care | Source: Ambulatory Visit | Attending: Gynecologic Oncology | Admitting: Gynecologic Oncology

## 2024-05-11 ENCOUNTER — Encounter (HOSPITAL_COMMUNITY): Payer: Self-pay | Admitting: Gynecologic Oncology

## 2024-05-11 DIAGNOSIS — D071 Carcinoma in situ of vulva: Secondary | ICD-10-CM

## 2024-05-11 DIAGNOSIS — N903 Dysplasia of vulva, unspecified: Secondary | ICD-10-CM

## 2024-05-11 HISTORY — PX: VULVECTOMY: SHX1086

## 2024-05-11 LAB — POCT PREGNANCY, URINE: Preg Test, Ur: NEGATIVE

## 2024-05-11 SURGERY — WIDE EXCISION VULVECTOMY
Anesthesia: General | Site: Vulva

## 2024-05-11 MED ORDER — EPHEDRINE 5 MG/ML INJ
INTRAVENOUS | Status: AC
  Filled 2024-05-11: qty 5

## 2024-05-11 MED ORDER — FENTANYL CITRATE (PF) 100 MCG/2ML IJ SOLN
INTRAMUSCULAR | Status: AC
  Filled 2024-05-11: qty 2

## 2024-05-11 MED ORDER — BUPIVACAINE LIPOSOME 1.3 % IJ SUSP
INTRAMUSCULAR | Status: AC
  Filled 2024-05-11: qty 20

## 2024-05-11 MED ORDER — DEXAMETHASONE SOD PHOSPHATE PF 10 MG/ML IJ SOLN: 4.0000 mg | INTRAMUSCULAR | Status: DC

## 2024-05-11 MED ORDER — ACETIC ACID 5 % SOLN
Status: AC
  Filled 2024-05-11: qty 12

## 2024-05-11 MED ORDER — PROPOFOL 10 MG/ML IV BOLUS
INTRAVENOUS | Status: DC | PRN
  Administered 2024-05-11: 200 mg via INTRAVENOUS

## 2024-05-11 MED ORDER — PROPOFOL 10 MG/ML IV BOLUS
INTRAVENOUS | Status: AC
  Filled 2024-05-11: qty 20

## 2024-05-11 MED ORDER — EPHEDRINE SULFATE (PRESSORS) 25 MG/5ML IV SOSY
PREFILLED_SYRINGE | INTRAVENOUS | Status: DC | PRN
  Administered 2024-05-11: 7.5 mg via INTRAVENOUS

## 2024-05-11 MED ORDER — DEXAMETHASONE SODIUM PHOSPHATE 4 MG/ML IJ SOLN
INTRAMUSCULAR | Status: DC | PRN
  Administered 2024-05-11: 10 mg via INTRAVENOUS

## 2024-05-11 MED ORDER — CHLORHEXIDINE GLUCONATE 0.12 % MT SOLN
15.0000 mL | Freq: Once | OROMUCOSAL | Status: AC
  Administered 2024-05-11: 15 mL via OROMUCOSAL

## 2024-05-11 MED ORDER — OXYCODONE HCL 5 MG/5ML PO SOLN: 5.0000 mg | Freq: Once | ORAL | Status: DC | PRN

## 2024-05-11 MED ORDER — MIDAZOLAM HCL 2 MG/2ML IJ SOLN
INTRAMUSCULAR | Status: AC
  Filled 2024-05-11: qty 2

## 2024-05-11 MED ORDER — MIDAZOLAM HCL 5 MG/5ML IJ SOLN
INTRAMUSCULAR | Status: DC | PRN
  Administered 2024-05-11: 2 mg via INTRAVENOUS

## 2024-05-11 MED ORDER — LIDOCAINE HCL (CARDIAC) PF 100 MG/5ML IV SOSY
PREFILLED_SYRINGE | INTRAVENOUS | Status: DC | PRN
  Administered 2024-05-11: 60 mg via INTRAVENOUS

## 2024-05-11 MED ORDER — ACETIC ACID 5 % SOLN
Status: DC | PRN
  Administered 2024-05-11: 1 via TOPICAL

## 2024-05-11 MED ORDER — MEPERIDINE HCL 25 MG/ML IJ SOLN: 6.2500 mg | INTRAMUSCULAR | Status: DC | PRN

## 2024-05-11 MED ORDER — OXYCODONE HCL 5 MG PO TABS: 5.0000 mg | ORAL_TABLET | Freq: Once | ORAL | Status: DC | PRN

## 2024-05-11 MED ORDER — AMISULPRIDE (ANTIEMETIC) 5 MG/2ML IV SOLN: 10.0000 mg | Freq: Once | INTRAVENOUS | Status: DC | PRN

## 2024-05-11 MED ORDER — ONDANSETRON HCL 4 MG/2ML IJ SOLN
INTRAMUSCULAR | Status: DC | PRN
  Administered 2024-05-11: 4 mg via INTRAVENOUS

## 2024-05-11 MED ORDER — BUPIVACAINE LIPOSOME 1.3 % IJ SUSP
INTRAMUSCULAR | Status: DC | PRN
  Administered 2024-05-11: 15 mL

## 2024-05-11 MED ORDER — SCOPOLAMINE 1 MG/3DAYS TD PT72
1.0000 | MEDICATED_PATCH | TRANSDERMAL | Status: DC
  Administered 2024-05-11: 1 mg via TRANSDERMAL
  Filled 2024-05-11: qty 1

## 2024-05-11 MED ORDER — HYDROMORPHONE HCL 1 MG/ML IJ SOLN: 0.2500 mg | INTRAMUSCULAR | Status: DC | PRN

## 2024-05-11 MED ORDER — ONDANSETRON HCL 4 MG/2ML IJ SOLN: 4.0000 mg | Freq: Four times a day (QID) | INTRAMUSCULAR | Status: DC | PRN

## 2024-05-11 MED ORDER — ORAL CARE MOUTH RINSE: 15.0000 mL | Freq: Once | OROMUCOSAL | Status: AC

## 2024-05-11 MED ORDER — BUPIVACAINE HCL (PF) 0.25 % IJ SOLN
INTRAMUSCULAR | Status: AC
  Filled 2024-05-11: qty 30

## 2024-05-11 MED ORDER — LIDOCAINE HCL (PF) 2 % IJ SOLN
INTRAMUSCULAR | Status: AC
  Filled 2024-05-11: qty 15

## 2024-05-11 MED ORDER — OXYCODONE HCL 5 MG PO TABS: 5.0000 mg | ORAL_TABLET | ORAL | Status: DC | PRN

## 2024-05-11 MED ORDER — TRAMADOL HCL 50 MG PO TABS: 50.0000 mg | ORAL_TABLET | Freq: Four times a day (QID) | ORAL | Status: DC | PRN

## 2024-05-11 MED ORDER — ACETAMINOPHEN 500 MG PO TABS
1000.0000 mg | ORAL_TABLET | ORAL | Status: AC
  Administered 2024-05-11: 1000 mg via ORAL
  Filled 2024-05-11: qty 2

## 2024-05-11 MED ORDER — BUPIVACAINE HCL (PF) 0.25 % IJ SOLN
INTRAMUSCULAR | Status: DC | PRN
  Administered 2024-05-11: 25 mL

## 2024-05-11 MED ORDER — FENTANYL CITRATE (PF) 100 MCG/2ML IJ SOLN
INTRAMUSCULAR | Status: DC | PRN
  Administered 2024-05-11: 50 ug via INTRAVENOUS
  Administered 2024-05-11 (×2): 25 ug via INTRAVENOUS

## 2024-05-11 MED ORDER — ONDANSETRON HCL 4 MG/2ML IJ SOLN
INTRAMUSCULAR | Status: AC
  Filled 2024-05-11: qty 6

## 2024-05-11 MED ORDER — GABAPENTIN 300 MG PO CAPS
300.0000 mg | ORAL_CAPSULE | ORAL | Status: AC
  Administered 2024-05-11: 300 mg via ORAL
  Filled 2024-05-11: qty 1

## 2024-05-11 MED ORDER — LACTATED RINGERS IV SOLN: INTRAVENOUS | Status: DC

## 2024-05-11 MED ORDER — ONDANSETRON HCL 4 MG PO TABS: 4.0000 mg | ORAL_TABLET | Freq: Four times a day (QID) | ORAL | Status: DC | PRN

## 2024-05-11 SURGICAL SUPPLY — 30 items
BAG COUNTER SPONGE SURGICOUNT (BAG) IMPLANT
BLADE SURG 15 STRL LF DISP TIS (BLADE) IMPLANT
DRAPE SHEET LG 3/4 BI-LAMINATE (DRAPES) ×1 IMPLANT
DRAPE SURG IRRIG POUCH 19X23 (DRAPES) ×1 IMPLANT
DRSG TELFA 3X8 NADH STRL (GAUZE/BANDAGES/DRESSINGS) ×1 IMPLANT
ELECT REM PT RETURN 15FT ADLT (MISCELLANEOUS) ×1 IMPLANT
GAUZE 4X4 16PLY ~~LOC~~+RFID DBL (SPONGE) ×1 IMPLANT
GAUZE SPONGE 4X4 12PLY STRL (GAUZE/BANDAGES/DRESSINGS) ×1 IMPLANT
GLOVE BIO SURGEON STRL SZ 6 (GLOVE) ×2 IMPLANT
GOWN STRL REUS W/ TWL LRG LVL3 (GOWN DISPOSABLE) ×1 IMPLANT
KIT TURNOVER KIT A (KITS) ×1 IMPLANT
NDL HYPO 21X1.5 SAFETY (NEEDLE) ×1 IMPLANT
NDL HYPO 25X1 1.5 SAFETY (NEEDLE) ×1 IMPLANT
NDL SPNL 22GX7 QUINCKE BK (NEEDLE) IMPLANT
NS IRRIG 1000ML POUR BTL (IV SOLUTION) ×1 IMPLANT
PACK PERINEAL COLD (PAD) ×1 IMPLANT
PACK VAGINAL WOMENS (CUSTOM PROCEDURE TRAY) ×1 IMPLANT
PAD PREP 24X48 CUFFED NSTRL (MISCELLANEOUS) ×1 IMPLANT
SCOPETTES 8 STERILE (MISCELLANEOUS) IMPLANT
SCRUB CHG 4% DYNA-HEX 4OZ (MISCELLANEOUS) ×1 IMPLANT
SUT VIC AB 0 CT1 27XBRD ANTBC (SUTURE) ×1 IMPLANT
SUT VIC AB 2-0 SH 27X BRD (SUTURE) ×1 IMPLANT
SUT VIC AB 3-0 SH 27XBRD (SUTURE) ×1 IMPLANT
SUT VIC AB 4-0 PS2 27 (SUTURE) ×1 IMPLANT
SYR BULB IRRIG 60ML STRL (SYRINGE) ×1 IMPLANT
SYR CONTROL 10ML LL (SYRINGE) IMPLANT
TOWEL OR DSP ST BLU DLX 10/PK (DISPOSABLE) ×1 IMPLANT
TRAY FOLEY MTR SLVR 16FR STAT (SET/KITS/TRAYS/PACK) IMPLANT
UNDERPAD 30X36 HEAVY ABSORB (UNDERPADS AND DIAPERS) ×1 IMPLANT
YANKAUER SUCT BULB TIP NO VENT (SUCTIONS) ×1 IMPLANT

## 2024-05-11 NOTE — Anesthesia Preprocedure Evaluation (Signed)
 Anesthesia Evaluation  Patient identified by MRN, date of birth, ID band Patient awake    Reviewed: Allergy  & Precautions, H&P , NPO status , Patient's Chart, lab work & pertinent test results  Airway Mallampati: II  TM Distance: >3 FB Neck ROM: Full    Dental  (+) Dental Advisory Given   Pulmonary asthma    Pulmonary exam normal breath sounds clear to auscultation       Cardiovascular negative cardio ROS Normal cardiovascular exam Rhythm:Regular Rate:Normal     Neuro/Psych  Headaches  Anxiety Depression     negative psych ROS   GI/Hepatic Neg liver ROS,GERD  ,,  Endo/Other  negative endocrine ROS    Renal/GU negative Renal ROS  negative genitourinary   Musculoskeletal negative musculoskeletal ROS (+)    Abdominal   Peds negative pediatric ROS (+)  Hematology negative hematology ROS (+)   Anesthesia Other Findings   Reproductive/Obstetrics negative OB ROS                              Anesthesia Physical Anesthesia Plan  ASA: 2  Anesthesia Plan: General   Post-op Pain Management: Tylenol  PO (pre-op)* and Gabapentin PO (pre-op)*   Induction: Intravenous  PONV Risk Score and Plan: 3 and Ondansetron , Dexamethasone, Midazolam and Treatment may vary due to age or medical condition  Airway Management Planned: LMA  Additional Equipment:   Intra-op Plan:   Post-operative Plan: Extubation in OR  Informed Consent: I have reviewed the patients History and Physical, chart, labs and discussed the procedure including the risks, benefits and alternatives for the proposed anesthesia with the patient or authorized representative who has indicated his/her understanding and acceptance.     Dental advisory given  Plan Discussed with: CRNA  Anesthesia Plan Comments:         Anesthesia Quick Evaluation

## 2024-05-11 NOTE — Anesthesia Postprocedure Evaluation (Signed)
 Anesthesia Post Note  Patient: Kathryn Bradley  Procedure(s) Performed: WIDE EXCISION VULVECTOMY (Vulva)     Patient location during evaluation: PACU Anesthesia Type: General Level of consciousness: awake and alert Pain management: pain level controlled Vital Signs Assessment: post-procedure vital signs reviewed and stable Respiratory status: spontaneous breathing, nonlabored ventilation and respiratory function stable Cardiovascular status: blood pressure returned to baseline and stable Postop Assessment: no apparent nausea or vomiting Anesthetic complications: no   No notable events documented.  Last Vitals:  Vitals:   05/11/24 0845 05/11/24 0900  BP: 127/77 126/70  Pulse: 89 85  Resp: 16 15  Temp:    SpO2: 98% 99%    Last Pain:  Vitals:   05/11/24 0900  TempSrc:   PainSc: 0-No pain                 Butler Levander P[place]

## 2024-05-11 NOTE — Op Note (Signed)
 Operative Report  PATIENT: Kathryn Bradley DATE: 05/11/24  Preop Diagnosis: VIN3  Postoperative Diagnosis: same as above  Surgery: Partial simple right posterior vulvectomy  Surgeons:  Viktoria Crank, MD  Assistant: none  Anesthesia: General   Estimated blood loss: 20 ml  IVF: see I&O flowsheet   Urine output: n/a   Complications: None apparent  Pathology: Right posterior vulva with marking stitch at 12 o'clock  Operative findings: On EUA, small area of leukoplakia (less than 1 cm) at 7 o'clock at the introitus. No other areas of leukoplakia. No ulcerations or visible lesions. After application of 5% acetic acid, no new lesions visualized.  Procedure: The patient was identified in the preoperative holding area. Informed consent was signed on the chart. Patient was seen history was reviewed and exam was performed.   The patient was then taken to the operating room and placed in the supine position with SCD hose on. General anesthesia was then induced without difficulty. She was then placed in the dorsolithotomy position. The perineum was prepped with Betadine. The vagina was prepped with Betadine. The patient was then draped after the prep was dried.   Timeout was performed the patient, procedure, antibiotic, allergy , and length of procedure. 5% acetic acid solution was applied to the perineum. The vulvar tissues were inspected for areas of acetowhite changes or leukoplakia. The lesion was identified and the marking pen was used to circumscribe the area with appropriate surgical margins. The subcuticular tissues were infiltrated with 0.25% marcaine . The 15 blade scalpel was used to make an incision through the skin circumferentially as marked. The skin elipse was grasped and was separated from the underlying deep dermal tissues with the bovie device. After the specimen had been completely resected, it was oriented and marked at 12 o'clock with a 0-vicryl suture. The bovie was  used to obtain hemostasis at the surgical bed. The subcutaneous tissues were irrigated and made hemostatic.   The deep dermal layer was approximated with 2-0 and 3-0 vicryl mattress sutures to bring the skin edges into approximation and off tension. The wound was closed following langher's lines. The cutaneous layer was closed with interrupted 4-0 vicryl stitches and mattress sutures to ensure a tension free and hemostatic closure. Exparel  was injected for local anesthesia. The perineum was again irrigated.   All instrument, suture, laparotomy, Ray-Tec, and needle counts were correct x2. The patient tolerated the procedure well and was taken recovery room in stable condition.  Crank Viktoria MD Gynecologic Oncology

## 2024-05-11 NOTE — Anesthesia Procedure Notes (Signed)
 Procedure Name: LMA Insertion Date/Time: 05/11/2024 7:42 AM  Performed by: Vincenzo Show, CRNAPre-anesthesia Checklist: Patient identified, Emergency Drugs available, Suction available, Patient being monitored and Timeout performed Patient Re-evaluated:Patient Re-evaluated prior to induction Oxygen Delivery Method: Circle system utilized Preoxygenation: Pre-oxygenation with 100% oxygen Induction Type: IV induction Ventilation: Mask ventilation without difficulty LMA: LMA inserted LMA Size: 4.0 Tube size: 4.0 mm Number of attempts: 1 Placement Confirmation: positive ETCO2 and breath sounds checked- equal and bilateral Tube secured with: Tape Dental Injury: Teeth and Oropharynx as per pre-operative assessment  Comments: Easy pass LMA.

## 2024-05-11 NOTE — Interval H&P Note (Signed)
 History and Physical Interval Note:  05/11/2024 6:21 AM  Kathryn Bradley  has presented today for surgery, with the diagnosis of D07.1 VIN 3.  The various methods of treatment have been discussed with the patient and family. After consideration of risks, benefits and other options for treatment, the patient has consented to  Procedure(s) with comments: WIDE EXCISION VULVECTOMY (N/A) - possible vulvar biopsies as a surgical intervention.  The patient's history has been reviewed, patient examined, no change in status, stable for surgery.  I have reviewed the patient's chart and labs.  Questions were answered to the patient's satisfaction.     Comer JONELLE Dollar

## 2024-05-11 NOTE — Transfer of Care (Signed)
 Immediate Anesthesia Transfer of Care Note  Patient: Kathryn Bradley  Procedure(s) Performed: Procedure(s) with comments: WIDE EXCISION VULVECTOMY (N/A) - possible vulvar biopsies  Patient Location: PACU  Anesthesia Type:General  Level of Consciousness:  sedated, patient cooperative and responds to stimulation  Airway & Oxygen Therapy:Patient Spontanous Breathing and Patient connected to face mask oxgen  Post-op Assessment:  Report given to PACU RN and Post -op Vital signs reviewed and stable  Post vital signs:  Reviewed and stable  Last Vitals:  Vitals:   05/11/24 0540  BP: 120/66  Pulse: 95  Resp: 16  Temp: 36.7 C  SpO2: 100%    Complications: No apparent anesthesia complications

## 2024-05-12 ENCOUNTER — Telehealth: Payer: Self-pay | Admitting: *Deleted

## 2024-05-12 ENCOUNTER — Encounter: Payer: Self-pay | Admitting: Gynecologic Oncology

## 2024-05-12 ENCOUNTER — Encounter (HOSPITAL_COMMUNITY): Payer: Self-pay | Admitting: Gynecologic Oncology

## 2024-05-12 LAB — SURGICAL PATHOLOGY

## 2024-05-12 NOTE — Telephone Encounter (Signed)
 Attempted to reach patient for post op call. Left voicemail requesting call back.

## 2024-05-12 NOTE — Telephone Encounter (Signed)
 Spoke with Ms. Perdew this morning. She states she is eating, drinking and urinating well. She has not had a BM yet but is passing gas. She is taking senokot as prescribed and encouraged her to drink plenty of water. She denies fever or chills. Incisions are dry and intact. She rates her pain 4/10. Her pain is controlled with tylenol .    Instructed to call office with any fever, chills, purulent drainage, uncontrolled pain or any other questions or concerns. Patient verbalizes understanding.   Pt aware of post op appointments as well as the office number [phone] and after hours number [phone] to call if she has any questions or concerns

## 2024-05-12 NOTE — Telephone Encounter (Addendum)
 Spoke with Kathryn Bradley in regards to her MyChart message. Advised patient that bruising and some swelling to her vulva is not uncommon. Dr.Tucker didn't do any biopsies and the small areas are from local medication (exparell) that were injected for pain relief and they sometimes bleed slightly, bovie is used to achieve hemostasis, this can look like small areas that have been cauterized.  Pt verbalized understanding and denies any discharge, fever or increased pain. Pt was very thankful for the call and is aware to call the office back with any increased pain not relieved by medication, swelling,fever, chills, bleeding and or discharge. Pt thanked the office for calling.

## 2024-05-14 ENCOUNTER — Ambulatory Visit: Payer: Self-pay | Admitting: Gynecologic Oncology

## 2024-05-15 ENCOUNTER — Encounter: Payer: Self-pay | Admitting: Hematology and Oncology

## 2024-05-18 ENCOUNTER — Inpatient Hospital Stay: Admitting: Gynecologic Oncology

## 2024-05-18 ENCOUNTER — Telehealth: Payer: Self-pay | Admitting: *Deleted

## 2024-05-18 NOTE — Telephone Encounter (Signed)
 Per Dr Viktoria canceled appt for [facility], patient  aware

## 2024-05-19 ENCOUNTER — Telehealth: Payer: Self-pay | Admitting: *Deleted

## 2024-05-19 NOTE — Telephone Encounter (Signed)
 Kathryn Bradley called to reports some pain that started yesterday, went away, then returned today as she was moving around. She describes the pain as located near her rectum. States that her stiches are intact. She states there are a few area that look more red, but not in the area where there is pain. Denies drainage or odor, fever, chills, or constipation. Although she has not had a BM since Tuesday night, she reports this is normal for her. She has frozen peas on the incision currently and her pain is controlled with Tylenol  and Ibuprofen . She was advised to call the on call number [phone] if the pain becomes severe or she develops fever chills or other signs of infection. Advised to take a stool softener and to avoid straining.

## 2024-05-22 ENCOUNTER — Telehealth: Payer: Self-pay | Admitting: *Deleted

## 2024-05-22 ENCOUNTER — Other Ambulatory Visit

## 2024-05-22 NOTE — Telephone Encounter (Signed)
Attempted to reach patient to check on symptoms. Left voicemail requesting call back.

## 2024-05-24 ENCOUNTER — Telehealth: Payer: Self-pay | Admitting: Hematology and Oncology

## 2024-05-24 NOTE — Telephone Encounter (Signed)
 I spoke to patient regarding 12/04/24 appt being rescheduled to 12/01/24. Patient is aware of new appt date and time.

## 2024-06-16 ENCOUNTER — Encounter: Payer: Self-pay | Admitting: Gynecologic Oncology

## 2024-06-16 ENCOUNTER — Inpatient Hospital Stay: Attending: Gynecologic Oncology | Admitting: Gynecologic Oncology

## 2024-06-16 VITALS — BP 101/82 | HR 78 | Temp 98.1°F | Resp 18 | Wt 131.8 lb

## 2024-06-16 DIAGNOSIS — Z9079 Acquired absence of other genital organ(s): Secondary | ICD-10-CM

## 2024-06-16 DIAGNOSIS — B3731 Acute candidiasis of vulva and vagina: Secondary | ICD-10-CM

## 2024-06-16 DIAGNOSIS — B379 Candidiasis, unspecified: Secondary | ICD-10-CM

## 2024-06-16 DIAGNOSIS — Z7189 Other specified counseling: Secondary | ICD-10-CM

## 2024-06-16 DIAGNOSIS — D071 Carcinoma in situ of vulva: Secondary | ICD-10-CM

## 2024-06-16 MED ORDER — FLUCONAZOLE 150 MG PO TABS: 150.0000 mg | ORAL_TABLET | Freq: Every day | ORAL | 1 refills | Status: AC

## 2024-06-16 NOTE — Patient Instructions (Signed)
 It was good to see you today.  You are healing very well.  I am sending a one-time dose of a medication to treat a yeast infection, presuming that that is what is causing her itching.  If symptoms do not improve within a couple of days of taking the medicine, please let me know.  My note from today to your OB/GYN.  I would recommend for at least the first year having visits every 6 months for follow-up.  After that, you can be seen every 6-12 months.  We also discussed regularly looking at your vulva and calling with any new spots that you see/feel or change to symptoms.

## 2024-06-16 NOTE — Progress Notes (Signed)
 Gynecologic Oncology Return Clinic Visit  06/16/2024  Reason for Visit: follow-up  Treatment History: Patient was seen in April for a yeast infection.  Completed 2 rounds of treatment for this without much change in her symptoms.  Denies any discharge but continued to have pruritus.  Seen in July with raised white lesion measuring 5 mm at right fourchette and associated pruritus.  She used triamcinolone 0.1% cream BID for 2 weeks in July and again September. Pruritus resolved but lesion still present. She felt tearing sensation at perineum intermittently when wiping after bowel movements or voiding.  Right vulvar biopsy on 04/06/24: HSIL/VIN3, margins involved.   Today, she comes in with her husband.  She notes a long history of intermittent vulvar itching.  Remembers in 2018 having persistent itching for months which then suddenly stopped on its own.   Pap 05/2022: NILM, HR HPV neg  05/11/24: Partial simple right posterior vulvectomy   Interval History: Doing well.  Had very minimal spotting immediately after the surgery.  Denies any discharge.  Has developed some pruritus over the last week.  Endorses normal bowel and bladder function.  Past Medical/Surgical History: Past Medical History:  Diagnosis Date   Abdominal pain    Allergic rhinitis    Anxiety    Asthma    Depression    Esophageal reflux    Family history of breast cancer    Family history of colon cancer    Family history of ovarian cancer    Lactose intolerance    Migraine headache     Past Surgical History:  Procedure Laterality Date   DILATION AND CURETTAGE OF UTERUS  2013   VULVECTOMY N/A 05/11/2024   Procedure: WIDE EXCISION VULVECTOMY;  Surgeon: Viktoria Comer SAUNDERS, MD;  Location: [facility];  Service: Gynecology;  Laterality: N/A;  possible vulvar biopsies   WISDOM TOOTH EXTRACTION  2005    Family History  Problem Relation Age of Onset   Breast cancer Mother 43       triple negative   Heart attack Mother     Heart disease Mother    Diabetes Mother    Heart Problems Father 3   Heart disease Father    Diabetes Maternal Grandmother    Ovarian cancer Maternal Grandmother    Macular degeneration Maternal Grandmother    Colon cancer Maternal Grandmother    Kidney cancer Maternal Grandfather    Bladder Cancer Maternal Grandfather    Skin cancer Maternal Grandfather    Diabetes Maternal Grandfather    Breast cancer Cousin        dx late 50s, mat first cousin   Macular degeneration Maternal Aunt     Social History   Socioeconomic History   Marital status: Married    Spouse name: Not on file   Number of children: Not on file   Years of education: Not on file   Highest education level: Associate degree: academic program  Occupational History   Not on file  Tobacco Use   Smoking status: Never    Passive exposure: Past   Smokeless tobacco: Never  Vaping Use   Vaping status: Never Used  Substance and Sexual Activity   Alcohol use: Yes    Comment: rare   Drug use: No   Sexual activity: Yes  Other Topics Concern   Not on file  Social History Narrative   Lives at home with her husband and 36 yo daughter   Right handed   Caffeine: 1-2 cups/day   Social  Drivers of Health   Tobacco Use: Low Risk (06/16/2024)   Patient History    Smoking Tobacco Use: Never    Smokeless Tobacco Use: Never    Passive Exposure: Past  Financial Resource Strain: Not on file  Food Insecurity: Not on file  Transportation Needs: Not on file  Physical Activity: Not on file  Stress: Not on file  Social Connections: Not on file  Depression (EYV7-0): Not on file  Alcohol Screen: Not on file  Housing: Not on file  Utilities: Not on file  Health Literacy: Not on file    Current Medications: Current Medications[1]  Review of Systems: + itching Denies appetite changes, fevers, chills, fatigue, unexplained weight changes. Denies hearing loss, neck lumps or masses, mouth sores, ringing in ears or voice  changes. Denies cough or wheezing.  Denies shortness of breath. Denies chest pain or palpitations. Denies leg swelling. Denies abdominal distention, pain, blood in stools, constipation, diarrhea, nausea, vomiting, or early satiety. Denies pain with intercourse, dysuria, frequency, hematuria or incontinence. Denies hot flashes, pelvic pain, vaginal bleeding or vaginal discharge.   Denies joint pain, back pain or muscle pain/cramps. Denies rash or wounds. Denies dizziness, headaches, numbness or seizures. Denies swollen lymph nodes or glands, denies easy bruising or bleeding. Denies anxiety, depression, confusion, or decreased concentration.  Physical Exam: BP 101/82 (BP Location: Right Arm, Patient Position: Sitting)   Pulse 78   Temp 98.1 F (36.7 C) (Oral)   Resp 18   Wt 131 lb 12.8 oz (59.8 kg)   SpO2 100%   BMI 23.35 kg/m  General: Alert, oriented, no acute distress. HEENT: Posterior oropharynx clear, sclera anicteric. Chest: Unlabored breathing on room air. Extremities: Grossly normal range of motion.  Warm, well perfused.  No edema bilaterally. GU: Normal appearing external genitalia without erythema, excoriation, or lesions.  Vulvar incision is healing well, 2 small sutures still in place.  No erythema, induration, or exudate.  Laboratory & Radiologic Studies: A. VULVA, RIGHT POSTERIOR, WIDE EXCISION:  - High-grade squamous intraepithelial lesion (VIN 3/H SIL)  - Margins not involved   Assessment & Plan: Kathryn Bradley is a 39 y.o. woman s/p recent WLE for VIN3.  Patient is doing well postoperatively.  Reviewed continued expectations.  Discussed pathology with her from surgery.  She was given a copy of her pathology report.  We discussed risk of recurrence in the setting of high-grade dysplasia which is approximately 30-35%.  Reviewed my recommendations for close surveillance, which includes visits initially every 6 months with her OB/GYN.  Also discussed regular self exams and  symptoms that should prompt a phone call or visit prior to her next scheduled visit.  Patient thinks that she received 1 dose of an HPV vaccine in her early 50s.  Did not finish the series.  Discussed that there may be some benefit to HPV vaccination in the setting of high-grade dysplasia related to HPV.  Although the data is mixed, discussed that HPV vaccination would help prevent new HPV infections and may help reduce the risk of recurrence of high-grade disease.  I encouraged the patient to speak with her OB/GYN at her next visit about getting the HPV vaccine if her insurance will cover.  Plan for empiric treatment of yeast infection.  Diflucan  sent to her pharmacy.  I asked her to let me know if itching does not improve over the next several days.  18 minutes of total time was spent for this patient encounter, including preparation, face-to-face counseling with the patient  and coordination of care, and documentation of the encounter.  Comer Dollar, MD  Division of Gynecologic Oncology  Department of Obstetrics and Gynecology  [facility]      [1]  Current Outpatient Medications:    [employer] SUNSCREEN SPF 30 EX, apply, Disp: , Rfl:    Fexofenadine HCl (ALLEGRA PO), Take 1 tablet by mouth daily., Disp: , Rfl:    fluconazole  (DIFLUCAN ) 150 MG tablet, Take 1 tablet (150 mg total) by mouth daily., Disp: 1 tablet, Rfl: 1   scopolamine  (TRANSDERM-SCOP) 1 MG/3DAYS, APPLY 1 PATCH ONTO THE SKIN EVERY 3 DAYS AS NEEDED FOR MOTION SICKNESS, Disp: , Rfl:    senna-docusate (SENOKOT-S) 8.6-50 MG tablet, Take 2 tablets by mouth at bedtime. Do not take if having diarrhea (Patient not taking: Reported on 06/13/2024), Disp: 30 tablet, Rfl: 2   traMADol  (ULTRAM ) 50 MG tablet, Take 1 tablet (50 mg total) by mouth every 6 (six) hours as needed for moderate pain (pain score 4-6). For AFTER surgery only, do not take and drive (Patient not taking: Reported on 06/13/2024), Disp: 10 tablet, Rfl:  0

## 2024-06-25 ENCOUNTER — Ambulatory Visit
Admission: RE | Admit: 2024-06-25 | Discharge: 2024-06-25 | Disposition: A | Source: Ambulatory Visit | Attending: Hematology and Oncology

## 2024-06-25 DIAGNOSIS — Z9189 Other specified personal risk factors, not elsewhere classified: Secondary | ICD-10-CM

## 2024-06-25 MED ORDER — GADOPICLENOL 0.5 MMOL/ML IV SOLN
6.0000 mL | Freq: Once | INTRAVENOUS | Status: AC | PRN
  Administered 2024-06-25: 6 mL via INTRAVENOUS

## 2024-06-28 ENCOUNTER — Ambulatory Visit: Payer: Self-pay | Admitting: Hematology and Oncology

## 2024-12-01 ENCOUNTER — Inpatient Hospital Stay: Admitting: Hematology and Oncology

## 2024-12-04 ENCOUNTER — Ambulatory Visit: Admitting: Hematology and Oncology
